# Patient Record
Sex: Male | Born: 1950 | Race: White | Hispanic: No | Marital: Single | Smoking: Never smoker
Health system: Southern US, Community
[De-identification: ages and names within clinical notes are randomized; demographics above are authoritative.]

## PROBLEM LIST (undated history)

## (undated) DIAGNOSIS — G4730 Sleep apnea, unspecified: Secondary | ICD-10-CM

## (undated) DIAGNOSIS — E7800 Pure hypercholesterolemia, unspecified: Secondary | ICD-10-CM

## (undated) DIAGNOSIS — R911 Solitary pulmonary nodule: Secondary | ICD-10-CM

## (undated) DIAGNOSIS — I10 Essential (primary) hypertension: Secondary | ICD-10-CM

## (undated) DIAGNOSIS — N289 Disorder of kidney and ureter, unspecified: Secondary | ICD-10-CM

## (undated) HISTORY — DX: Solitary pulmonary nodule: R91.1

## (undated) HISTORY — PX: NEPHRECTOMY: SHX65

---

## 2003-11-21 ENCOUNTER — Emergency Department (HOSPITAL_COMMUNITY): Admission: EM | Admit: 2003-11-21 | Discharge: 2003-11-21 | Payer: Self-pay | Admitting: Family Medicine

## 2008-03-15 ENCOUNTER — Emergency Department (HOSPITAL_COMMUNITY): Admission: EM | Admit: 2008-03-15 | Discharge: 2008-03-15 | Payer: Self-pay | Admitting: Emergency Medicine

## 2008-03-18 ENCOUNTER — Encounter: Admission: RE | Admit: 2008-03-18 | Discharge: 2008-03-18 | Payer: Self-pay | Admitting: Internal Medicine

## 2008-03-22 ENCOUNTER — Observation Stay (HOSPITAL_COMMUNITY): Admission: AD | Admit: 2008-03-22 | Discharge: 2008-03-23 | Payer: Self-pay | Admitting: Internal Medicine

## 2008-03-22 ENCOUNTER — Encounter: Payer: Self-pay | Admitting: Emergency Medicine

## 2009-11-14 ENCOUNTER — Ambulatory Visit: Payer: Self-pay | Admitting: Internal Medicine

## 2009-11-14 ENCOUNTER — Observation Stay (HOSPITAL_COMMUNITY): Admission: EM | Admit: 2009-11-14 | Discharge: 2009-11-15 | Payer: Self-pay | Admitting: Emergency Medicine

## 2009-11-15 ENCOUNTER — Encounter (INDEPENDENT_AMBULATORY_CARE_PROVIDER_SITE_OTHER): Payer: Self-pay | Admitting: Emergency Medicine

## 2010-08-22 LAB — POCT CARDIAC MARKERS
CKMB, poc: <1 ng/mL — ABNORMAL LOW (ref 1.0–8.0)
CKMB, poc: <1 ng/mL — ABNORMAL LOW (ref 1.0–8.0)
Troponin i, poc: <0.05 ng/mL (ref 0.00–0.09)
Troponin i, poc: <0.05 ng/mL (ref 0.00–0.09)
Troponin i, poc: <0.05 ng/mL (ref 0.00–0.09)

## 2010-08-22 LAB — DIFFERENTIAL
Basophils Absolute: 0 10*3/uL (ref 0.0–0.1)
Basophils Relative: 1 % (ref 0–1)
Eosinophils Relative: 5 % (ref 0–5)
Lymphocytes Relative: 28 % (ref 12–46)
Monocytes Absolute: 0.7 10*3/uL (ref 0.1–1.0)
Monocytes Relative: 9 % (ref 3–12)
Neutrophils Relative %: 57 % (ref 43–77)

## 2010-08-22 LAB — CBC: RBC: 4.38 MIL/uL (ref 4.22–5.81)

## 2010-08-22 LAB — BASIC METABOLIC PANEL
CO2: 25 mEq/L (ref 19–32)
Chloride: 109 mEq/L (ref 96–112)
GFR calc Af Amer: >60 mL/min (ref >60)
GFR calc non Af Amer: >60 mL/min (ref >60)
Glucose, Bld: 96 mg/dL (ref 70–99)
Potassium: 3.2 mEq/L — ABNORMAL LOW (ref 3.5–5.1)
Sodium: 137 mEq/L (ref 135–145)

## 2010-10-18 NOTE — H&P (Signed)
NAME:  Ethan Torres, Ethan Torres NO.:  [id number]   MEDICAL RECORD NO.:  [id number]          PATIENT TYPE:  EMS   LOCATION:  ED                           FACILITY:  [facility]   PHYSICIAN:  Lucita Ferrara, MD         DATE OF BIRTH:  18-Jul-1950   DATE OF ADMISSION:  03/22/2008  DATE OF DISCHARGE:                              HISTORY & PHYSICAL   PRIMARY CARE PHYSICIAN:  [facility], unassigned.   CHIEF COMPLAINT:  Midepigastric burning chest pain.   The patient is a 60 year old Caucasian male with past medical history  significant for obstructive sleep apnea, hypertension who presents to  [facility] with chest pain that is described as burning  in nature, accompanied by food intake associated with some nausea.  He  also had some questionable right upper quadrant pain that was noticed  about 1 week ago by his primary care doctor at [facility].  He was  written a script for gallbladder ultrasound which was within normal.  The patient denies history of coronary artery disease, cardiovascular  disease, arrhythmias.  He denies ever having colonoscopy or endoscopy.  He is 27 and he says I am due for one.  He denies orthopnea,  paroxysmal nocturnal dyspnea, lower extremity edema or symptoms  associated with congestive heart failure or cardiopulmonary disease.  He  is a nonsmoker.  Denies alcohol.  Denies nonsteroidal anti-inflammatory  medications.  He denies changes in his bowel habits; constipation,  diarrhea.  He denies dark or black stools.  He denies recent sick  contacts or travel history.  Risk factors for coronary artery disease  are moderate.  He has no family history of early coronary artery disease  although his father died of metastatic lung cancer supposedly at an  early age.  His mother is still alive and he said she does not have  coronary artery disease.  Unknown whether there is any family history of  colon cancer.   PAST MEDICAL HISTORY:  1. As above,  significant for obstructive sleep apnea.  2. Hypertension.   FAMILY HISTORY:  As above.  Father had lung cancer.  Mother is alive and  well.  Siblings do not have premature coronary artery disease.   PAST SURGICAL HISTORY:  Status post tonsillectomy.   SOCIAL HISTORY:  Denies drugs, alcohol or tobacco.   ALLERGIES:  No known drug allergies.   MEDICATIONS AT HOME:  1. Include atenolol 50 mg p.o. daily.  2. Hydrochlorothiazide 25 mg p.o. daily.  3. Aspirin 81 mg p.o. daily.  4. Nexium 40 mg which he started October 2009.  5. Ambien CR 12.5 mg at bedtime.  6. Promethazine 25 mg a day.  7. Lexapro 5 mg daily.   REVIEW OF SYSTEMS:  As per HPI otherwise negative.   PHYSICAL EXAMINATION:  GENERAL:  Generally speaking the patient is in no  acute distress.  VITAL SIGNS:  Blood pressure is 110/66, pulse is 52, respirations 18,  pulse ox 100% on room air.  HEENT:  Normocephalic, atraumatic.  Sclerae is anicteric.  NECK:  Supple.  No JVD, no carotid bruits.  PERRLA.  Extraocular muscles  intact.  CARDIOVASCULAR:  S1, S2.  Regular rate and rhythm.  No murmurs, rubs or  clicks.  LUNGS:  Clear to auscultation bilaterally.  No rhonchi, rales or  wheezes.  ABDOMEN:  Soft, nontender, nondistended.  Positive bowel sounds.  NEUROLOGICAL:  The patient is alert and oriented x3.  Cranial nerves II-  XII grossly intact.  EXTREMITIES:  No clubbing, cyanosis or edema.   EKG - pulse is 52, sinus bradycardia.  There is some right axis  deviation.  There is inverted T-waves in aVL 2, 1, with artifact in 3  and aVF.   LABORATORY DATA:  The patient had cardiac markers x2 which is  essentially negative.  Chem stat within normal limits.  Lipase, complete  metabolic panel, CBC dated March 15, 2008, were not repeated here.  However, overall is normal.   RADIOLOGICAL DATA:  The patient had a chest x-ray which shows no active  cardiopulmonary disease.  Ultrasound of the abdomen which shows normal   gallbladder, no biliary dilatation, mild fatty infiltration of the  liver.   ASSESSMENT AND PLAN:  A 60 year old with:  1. Atypical chest pain associated with food, gastritis, nausea.  Risk      factors for coronary artery disease include hypertension,      obstructive sleep apnea, hyperlipidemia.  2. EKG abnormalities with inverted T-waves in nonfocal leads including      1, 2, aVL, nonspecific, consider read error.  Will need to compare      to old EKG which is not present here now.  3. Gastroesophageal reflux disease.  4. Hypertension, well-controlled.  5. Hyperlipidemia, well-controlled.   DISCUSSION AND PLAN:  We will go ahead and admit the patient to the  medical telemetry unit.  Will repeat his EKG.  ACS protocol.  Will cycle  enzymes x3 every 8 hours.  Continue with Ecotrin.  Protonix 40 mg p.o.  b.i.d.  If his cardiac enzymes are negative the patient will benefit  from outpatient stress test to be scheduled.  The patient also needs a  GI workup including colonoscopy and endoscopy as he is due for  colonoscopy at age greater than 89.  Endoscopy will be beneficial given  his recurrent gastroesophageal reflux disease symptoms.  If his nausea  persists even though his gallbladder ultrasound is negative and LFTs  which I am going to repeat now are abnormal the patient would benefit  from a HIDA scan to make sure that he does not have a thick gallbladder.  Also, I will proceed with getting a lipase on him.  The rest of the  plans will depend on his progress.      Lucita Ferrara, MD  Electronically Signed     RR/MEDQ  D:  03/22/2008  T:  03/22/2008  Job:  [id number]

## 2011-01-30 ENCOUNTER — Emergency Department (HOSPITAL_COMMUNITY)
Admission: EM | Admit: 2011-01-30 | Discharge: 2011-01-30 | Disposition: A | Discharge status: Home / Self Care | Payer: 59 | Attending: Emergency Medicine | Admitting: Emergency Medicine

## 2011-01-30 ENCOUNTER — Emergency Department (HOSPITAL_COMMUNITY): Payer: 59

## 2011-01-30 DIAGNOSIS — R059 Cough, unspecified: Secondary | ICD-10-CM | POA: Insufficient documentation

## 2011-01-30 DIAGNOSIS — K219 Gastro-esophageal reflux disease without esophagitis: Secondary | ICD-10-CM | POA: Insufficient documentation

## 2011-01-30 DIAGNOSIS — E876 Hypokalemia: Secondary | ICD-10-CM | POA: Insufficient documentation

## 2011-01-30 DIAGNOSIS — R61 Generalized hyperhidrosis: Secondary | ICD-10-CM | POA: Insufficient documentation

## 2011-01-30 DIAGNOSIS — F411 Generalized anxiety disorder: Secondary | ICD-10-CM | POA: Insufficient documentation

## 2011-01-30 DIAGNOSIS — R110 Nausea: Secondary | ICD-10-CM | POA: Insufficient documentation

## 2011-01-30 DIAGNOSIS — E7800 Pure hypercholesterolemia, unspecified: Secondary | ICD-10-CM | POA: Insufficient documentation

## 2011-01-30 DIAGNOSIS — R05 Cough: Secondary | ICD-10-CM | POA: Insufficient documentation

## 2011-01-30 DIAGNOSIS — I10 Essential (primary) hypertension: Secondary | ICD-10-CM | POA: Insufficient documentation

## 2011-01-30 DIAGNOSIS — R6883 Chills (without fever): Secondary | ICD-10-CM | POA: Insufficient documentation

## 2011-01-30 DIAGNOSIS — Z7982 Long term (current) use of aspirin: Secondary | ICD-10-CM | POA: Insufficient documentation

## 2011-01-30 DIAGNOSIS — R0789 Other chest pain: Secondary | ICD-10-CM | POA: Insufficient documentation

## 2011-01-30 DIAGNOSIS — Z79899 Other long term (current) drug therapy: Secondary | ICD-10-CM | POA: Insufficient documentation

## 2011-01-30 LAB — POCT I-STAT, CHEM 8
Calcium, Ion: 1.19 mmol/L (ref 1.12–1.32)
Creatinine, Ser: 1.1 mg/dL (ref 0.50–1.35)
Glucose, Bld: 112 mg/dL — ABNORMAL HIGH (ref 70–99)
Hemoglobin: 15.3 g/dL (ref 13.0–17.0)
Potassium: 3 mEq/L — ABNORMAL LOW (ref 3.5–5.1)
Sodium: 141 mEq/L (ref 135–145)
TCO2: 25 mmol/L (ref 0–100)

## 2011-01-30 LAB — CBC
Platelets: 202 10*3/uL (ref 150–400)
RBC: 4.53 MIL/uL (ref 4.22–5.81)

## 2011-01-30 LAB — POCT I-STAT TROPONIN I

## 2011-02-18 DIAGNOSIS — I10 Essential (primary) hypertension: Secondary | ICD-10-CM | POA: Insufficient documentation

## 2011-02-18 DIAGNOSIS — R079 Chest pain, unspecified: Secondary | ICD-10-CM | POA: Insufficient documentation

## 2011-03-07 LAB — COMPREHENSIVE METABOLIC PANEL
ALT: 21
ALT: 25
Albumin: 3.9
Alkaline Phosphatase: 51
Alkaline Phosphatase: 52
BUN: 14
BUN: 15
BUN: 15
CO2: 28
CO2: 28
Calcium: 9.2
Calcium: 9.8
Chloride: 104
Chloride: 97
Creatinine, Ser: 1.13
Creatinine, Ser: 1.24
GFR calc non Af Amer: >60
GFR calc non Af Amer: >60
GFR calc non Af Amer: >60
Glucose, Bld: 101 — ABNORMAL HIGH
Glucose, Bld: 111 — ABNORMAL HIGH
Glucose, Bld: 116 — ABNORMAL HIGH
Potassium: 2.7 — CL
Sodium: 137
Sodium: 142
Total Bilirubin: 1.1
Total Protein: 6.1
Total Protein: 7.4

## 2011-03-07 LAB — DIFFERENTIAL
Basophils Relative: 0
Eosinophils Absolute: 0.1
Eosinophils Relative: 1
Lymphocytes Relative: 14
Lymphs Abs: 1.4
Neutro Abs: 8.3 — ABNORMAL HIGH

## 2011-03-07 LAB — CARDIAC PANEL(CRET KIN+CKTOT+MB+TROPI)
CK, MB: 1.3
Relative Index: 1.2
Relative Index: INVALID
Total CK: 109
Total CK: 98

## 2011-03-07 LAB — URINALYSIS, ROUTINE W REFLEX MICROSCOPIC
Bilirubin Urine: NEGATIVE
Glucose, UA: NEGATIVE
Hgb urine dipstick: NEGATIVE
Protein, ur: NEGATIVE
Specific Gravity, Urine: 1.021
pH: 8

## 2011-03-07 LAB — POCT CARDIAC MARKERS
CKMB, poc: 1.2
Myoglobin, poc: 92.3
Troponin i, poc: <0.05

## 2011-03-07 LAB — CBC
HCT: 43.1
HCT: 43.7
MCHC: 34.1
MCV: 98
Platelets: 196
RBC: 4.46
RBC: 4.48
RDW: 12.8
WBC: 10.5
WBC: 5.3
WBC: 7.2

## 2011-03-07 LAB — POCT I-STAT, CHEM 8
Calcium, Ion: 1.11 — ABNORMAL LOW
Chloride: 101
Creatinine, Ser: 1.4
Hemoglobin: 14.3
TCO2: 28

## 2011-03-07 LAB — LIPASE, BLOOD
Lipase: 39
Lipase: 40
Lipase: 40

## 2011-03-07 LAB — B-NATRIURETIC PEPTIDE (CONVERTED LAB): Pro B Natriuretic peptide (BNP): 64

## 2011-03-07 LAB — LIPID PANEL
Cholesterol: 126
LDL Cholesterol: 84
VLDL: 17

## 2011-03-07 LAB — H. PYLORI ANTIBODY, IGG
H Pylori IgG: <0.4
H Pylori IgG: <0.4

## 2011-04-02 DIAGNOSIS — M109 Gout, unspecified: Secondary | ICD-10-CM

## 2011-04-02 HISTORY — DX: Gout, unspecified: M10.9

## 2012-11-21 DIAGNOSIS — G4700 Insomnia, unspecified: Secondary | ICD-10-CM | POA: Insufficient documentation

## 2013-08-04 ENCOUNTER — Emergency Department (HOSPITAL_COMMUNITY): Payer: 59

## 2013-08-04 ENCOUNTER — Emergency Department (HOSPITAL_COMMUNITY)
Admission: EM | Admit: 2013-08-04 | Discharge: 2013-08-04 | Disposition: A | Discharge status: Home / Self Care | Payer: 59 | Attending: Emergency Medicine | Admitting: Emergency Medicine

## 2013-08-04 ENCOUNTER — Encounter (HOSPITAL_COMMUNITY): Payer: Self-pay | Admitting: Emergency Medicine

## 2013-08-04 DIAGNOSIS — N289 Disorder of kidney and ureter, unspecified: Secondary | ICD-10-CM

## 2013-08-04 DIAGNOSIS — Z8639 Personal history of other endocrine, nutritional and metabolic disease: Secondary | ICD-10-CM | POA: Insufficient documentation

## 2013-08-04 DIAGNOSIS — Z87442 Personal history of urinary calculi: Secondary | ICD-10-CM | POA: Insufficient documentation

## 2013-08-04 DIAGNOSIS — Z862 Personal history of diseases of the blood and blood-forming organs and certain disorders involving the immune mechanism: Secondary | ICD-10-CM | POA: Insufficient documentation

## 2013-08-04 DIAGNOSIS — N23 Unspecified renal colic: Secondary | ICD-10-CM | POA: Insufficient documentation

## 2013-08-04 DIAGNOSIS — R110 Nausea: Secondary | ICD-10-CM | POA: Insufficient documentation

## 2013-08-04 DIAGNOSIS — I10 Essential (primary) hypertension: Secondary | ICD-10-CM | POA: Insufficient documentation

## 2013-08-04 HISTORY — DX: Sleep apnea, unspecified: G47.30

## 2013-08-04 HISTORY — DX: Essential (primary) hypertension: I10

## 2013-08-04 HISTORY — DX: Pure hypercholesterolemia, unspecified: E78.00

## 2013-08-04 HISTORY — DX: Disorder of kidney and ureter, unspecified: N28.9

## 2013-08-04 LAB — CBC WITH DIFFERENTIAL/PLATELET
BASOS PCT: 0 % (ref 0–1)
Basophils Absolute: 0 10*3/uL (ref 0.0–0.1)
EOS ABS: 0.2 10*3/uL (ref 0.0–0.7)
Eosinophils Relative: 2 % (ref 0–5)
HEMATOCRIT: 36.5 % — AB (ref 39.0–52.0)
HEMOGLOBIN: 13.1 g/dL (ref 13.0–17.0)
Lymphocytes Relative: 11 % — ABNORMAL LOW (ref 12–46)
Lymphs Abs: 1.1 10*3/uL (ref 0.7–4.0)
MCH: 33.7 pg (ref 26.0–34.0)
MCHC: 35.9 g/dL (ref 30.0–36.0)
MCV: 93.8 fL (ref 78.0–100.0)
MONO ABS: 0.8 10*3/uL (ref 0.1–1.0)
MONOS PCT: 8 % (ref 3–12)
NEUTROS PCT: 79 % — AB (ref 43–77)
Neutro Abs: 7.9 10*3/uL — ABNORMAL HIGH (ref 1.7–7.7)
Platelets: 179 10*3/uL (ref 150–400)
RBC: 3.89 MIL/uL — ABNORMAL LOW (ref 4.22–5.81)
RDW: 11.8 % (ref 11.5–15.5)
WBC: 10 10*3/uL (ref 4.0–10.5)

## 2013-08-04 LAB — URINALYSIS, ROUTINE W REFLEX MICROSCOPIC
BILIRUBIN URINE: NEGATIVE
Glucose, UA: NEGATIVE mg/dL
KETONES UR: NEGATIVE mg/dL
Leukocytes, UA: NEGATIVE
NITRITE: NEGATIVE
PH: 7 (ref 5.0–8.0)
Protein, ur: NEGATIVE mg/dL
Specific Gravity, Urine: 1.015 (ref 1.005–1.030)
Urobilinogen, UA: 0.2 mg/dL (ref 0.0–1.0)

## 2013-08-04 LAB — COMPREHENSIVE METABOLIC PANEL
ALBUMIN: 3.4 g/dL — AB (ref 3.5–5.2)
ALT: 19 U/L (ref 0–53)
AST: 20 U/L (ref 0–37)
Alkaline Phosphatase: 50 U/L (ref 39–117)
BUN: 26 mg/dL — AB (ref 6–23)
CO2: 26 mEq/L (ref 19–32)
CREATININE: 1.41 mg/dL — AB (ref 0.50–1.35)
Calcium: 8 mg/dL — ABNORMAL LOW (ref 8.4–10.5)
Chloride: 104 mEq/L (ref 96–112)
GFR calc Af Amer: 61 mL/min — ABNORMAL LOW (ref >90)
GFR calc non Af Amer: 53 mL/min — ABNORMAL LOW (ref >90)
Glucose, Bld: 108 mg/dL — ABNORMAL HIGH (ref 70–99)
Potassium: 3.4 mEq/L — ABNORMAL LOW (ref 3.7–5.3)
Sodium: 140 mEq/L (ref 137–147)
TOTAL PROTEIN: 6.1 g/dL (ref 6.0–8.3)
Total Bilirubin: 0.5 mg/dL (ref 0.3–1.2)

## 2013-08-04 LAB — URINE MICROSCOPIC-ADD ON

## 2013-08-04 MED ORDER — SODIUM CHLORIDE 0.9 % IV SOLN
1000.0000 mL | INTRAVENOUS | Status: DC
  Administered 2013-08-04: 1000 mL via INTRAVENOUS

## 2013-08-04 MED ORDER — HYDROMORPHONE HCL PF 1 MG/ML IJ SOLN
1.0000 mg | Freq: Once | INTRAMUSCULAR | Status: AC
  Administered 2013-08-04: 1 mg via INTRAVENOUS
  Filled 2013-08-04: qty 1

## 2013-08-04 MED ORDER — ONDANSETRON HCL 4 MG/2ML IJ SOLN
4.0000 mg | Freq: Once | INTRAMUSCULAR | Status: AC
  Administered 2013-08-04: 4 mg via INTRAVENOUS
  Filled 2013-08-04: qty 2

## 2013-08-04 MED ORDER — DIPHENHYDRAMINE HCL 50 MG/ML IJ SOLN
25.0000 mg | Freq: Once | INTRAMUSCULAR | Status: AC
  Administered 2013-08-04: 25 mg via INTRAVENOUS
  Filled 2013-08-04: qty 1

## 2013-08-04 MED ORDER — METOCLOPRAMIDE HCL 5 MG/ML IJ SOLN
10.0000 mg | Freq: Once | INTRAMUSCULAR | Status: AC
  Administered 2013-08-04: 10 mg via INTRAVENOUS
  Filled 2013-08-04: qty 2

## 2013-08-04 MED ORDER — OXYCODONE-ACETAMINOPHEN 5-325 MG PO TABS: 1.0000 | ORAL_TABLET | ORAL | Status: DC | PRN

## 2013-08-04 MED ORDER — TAMSULOSIN HCL 0.4 MG PO CAPS: 0.4000 mg | ORAL_CAPSULE | Freq: Every day | ORAL | Status: DC

## 2013-08-04 MED ORDER — SODIUM CHLORIDE 0.9 % IV SOLN
1000.0000 mL | Freq: Once | INTRAVENOUS | Status: AC
Start: 2013-08-04 — End: 2013-08-04
  Administered 2013-08-04: 1000 mL via INTRAVENOUS

## 2013-08-04 MED ORDER — METOCLOPRAMIDE HCL 10 MG PO TABS: 10.0000 mg | ORAL_TABLET | Freq: Four times a day (QID) | ORAL | Status: DC | PRN

## 2013-08-04 NOTE — ED Notes (Signed)
C/o nausea, pain still under control. 1/10

## 2013-08-04 NOTE — ED Provider Notes (Signed)
CSN: [id number]     Arrival date & time 08/04/13  0425 History   None    Chief Complaint  Patient presents with  . Flank Pain     (Consider location/radiation/quality/duration/timing/severity/associated sxs/prior Treatment) Patient is a 63 y.o. male presenting with flank pain. The history is provided by the patient.  Flank Pain  He was awakened at 1 AM with severe, sharp left flank pain without radiation. There is associated nausea without vomiting. There was no fever, chills, or sweats. He denies any urinary symptoms. Pain is rated at 7/10. He was unable to find a position that was comfortable. Nothing made the pain better nothing made it worse. He came in by ambulance and was given a dose of fentanyl with partial relief of pain. He states he does have a history of kidney stones many years ago.  Past Medical History  Diagnosis Date  . Renal disorder   . Hypertension   . High cholesterol   . Sleep apnea    History reviewed. No pertinent past surgical history. No family history on file. History  Substance Use Topics  . Smoking status: Never Smoker   . Smokeless tobacco: Not on file  . Alcohol Use: No    Review of Systems  Genitourinary: Positive for flank pain.  All other systems reviewed and are negative.      Allergies  Review of patient's allergies indicates no known allergies.  Home Medications  No current outpatient prescriptions on file. BP 129/75  Pulse 64  Temp(Src) 98.2 F (36.8 C) (Oral)  Resp 18  Ht 5\' 11"  (1.803 m)  Wt 187 lb (84.823 kg)  BMI 26.09 kg/m2  SpO2 98% Physical Exam  Nursing note and vitals reviewed.  63 year old male, resting comfortably and in no acute distress. Vital signs are normal. Oxygen saturation is 98%, which is normal. Head is normocephalic and atraumatic. PERRLA, EOMI. Oropharynx is clear. Neck is nontender and supple without adenopathy or JVD. Back is nontender in the midline. There is moderate left CVA tenderness. Lungs  are clear without rales, wheezes, or rhonchi. Chest is nontender. Heart has regular rate and rhythm without murmur. Abdomen is soft, flat, nontender without masses or hepatosplenomegaly and peristalsis is hypoactive. Extremities have no cyanosis or edema, full range of motion is present. Skin is warm and dry without rash. Neurologic: Mental status is normal, cranial nerves are intact, there are no motor or sensory deficits.  ED Course  Procedures (including critical care time) Labs Review Results for orders placed during the hospital encounter of 08/04/13  URINALYSIS, ROUTINE W REFLEX MICROSCOPIC      Result Value Ref Range   Color, Urine YELLOW  YELLOW   APPearance CLEAR  CLEAR   Specific Gravity, Urine 1.015  1.005 - 1.030   pH 7.0  5.0 - 8.0   Glucose, UA NEGATIVE  NEGATIVE mg/dL   Hgb urine dipstick LARGE (*) NEGATIVE   Bilirubin Urine NEGATIVE  NEGATIVE   Ketones, ur NEGATIVE  NEGATIVE mg/dL   Protein, ur NEGATIVE  NEGATIVE mg/dL   Urobilinogen, UA 0.2  0.0 - 1.0 mg/dL   Nitrite NEGATIVE  NEGATIVE   Leukocytes, UA NEGATIVE  NEGATIVE  CBC WITH DIFFERENTIAL      Result Value Ref Range   WBC 10.0  4.0 - 10.5 K/uL   RBC 3.89 (*) 4.22 - 5.81 MIL/uL   Hemoglobin 13.1  13.0 - 17.0 g/dL   HCT 40.9 (*) 81.1 - 91.4 %   MCV 93.8  78.0 - 100.0 fL   MCH 33.7  26.0 - 34.0 pg   MCHC 35.9  30.0 - 36.0 g/dL   RDW 82.911.8  56.211.5 - 13.015.5 %   Platelets 179  150 - 400 K/uL   Neutrophils Relative % 79 (*) 43 - 77 %   Neutro Abs 7.9 (*) 1.7 - 7.7 K/uL   Lymphocytes Relative 11 (*) 12 - 46 %   Lymphs Abs 1.1  0.7 - 4.0 K/uL   Monocytes Relative 8  3 - 12 %   Monocytes Absolute 0.8  0.1 - 1.0 K/uL   Eosinophils Relative 2  0 - 5 %   Eosinophils Absolute 0.2  0.0 - 0.7 K/uL   Basophils Relative 0  0 - 1 %   Basophils Absolute 0.0  0.0 - 0.1 K/uL  COMPREHENSIVE METABOLIC PANEL      Result Value Ref Range   Sodium 140  137 - 147 mEq/L   Potassium 3.4 (*) 3.7 - 5.3 mEq/L   Chloride 104  96 -  112 mEq/L   CO2 26  19 - 32 mEq/L   Glucose, Bld 108 (*) 70 - 99 mg/dL   BUN 26 (*) 6 - 23 mg/dL   Creatinine, Ser 8.651.41 (*) 0.50 - 1.35 mg/dL   Calcium 8.0 (*) 8.4 - 10.5 mg/dL   Total Protein 6.1  6.0 - 8.3 g/dL   Albumin 3.4 (*) 3.5 - 5.2 g/dL   AST 20  0 - 37 U/L   ALT 19  0 - 53 U/L   Alkaline Phosphatase 50  39 - 117 U/L   Total Bilirubin 0.5  0.3 - 1.2 mg/dL   GFR calc non Af Amer 53 (*) >90 mL/min   GFR calc Af Amer 61 (*) >90 mL/min  URINE MICROSCOPIC-ADD ON      Result Value Ref Range   Squamous Epithelial / LPF RARE  RARE   WBC, UA 0-2  <3 WBC/hpf   RBC / HPF 7-10  <3 RBC/hpf   Bacteria, UA RARE  RARE   Imaging Review Ct Abdomen Pelvis Wo Contrast  08/04/2013   CLINICAL DATA:  Left flank pain, history of kidney stones.  EXAM: CT ABDOMEN AND PELVIS WITHOUT CONTRAST  TECHNIQUE: Multidetector CT imaging of the abdomen and pelvis was performed following the standard protocol without intravenous contrast.  COMPARISON:  US ABDOMEN COMPLETE dated 03/18/2008  FINDINGS: Included view of the lung bases are clear. The visualized heart and pericardium are unremarkable.  Kidneys are orthotopic, demonstrating normal size and morphology. 3 mm left lower pole renal calculus, punctate left upper pole calculus. Mild to moderate left hydroureteronephrosis to the level of the mid ureter where a 5 mm calculus is seen. Left perinephric free fluid may reflect caliceal rupture. Nephrolithiasis, hydronephrosis; limited assessment for renal masses on this nonenhanced examination. The unopacified ureters are normal in course and caliber. Urinary bladder is partially distended and unremarkable.  The liver, spleen, gallbladder, pancreas and adrenal glands are unremarkable for this non-contrast examination.  The stomach, small and large bowel are normal in course and caliber without inflammatory changes, the sensitivity may be decreased by lack of enteric contrast. Descending and sigmoid colonic diverticulosis.  Normal appendix. No intraperitoneal free fluid nor free air. No abscess.  Great vessels are normal in course and caliber. No lymphadenopathy by CT size criteria. Prostate gland is enlarged, 5.5 cm in transaxial dimension, invading the base of the urinary bladder with mild urinary bladder circumferential wall thickening. The  soft tissues and included osseous structures are nonsuspicious. Degenerative change of the lumbar spine with mild-to-moderate L4-5 and L5-S1 neural foraminal narrowing.  IMPRESSION: Mild to moderate left hydroureteronephrosis to the level of the mid ureter where a 5 mm calculus is seen. Free fluid about the left kidney may reflect caliceal rupture. Residual left nephrolithiasis measuring up to 3 mm.  Prostatomegaly, invading the base of the urinary bladder with mild urinary bladder wall thickening which could reflect chronic urinary bladder obstruction or, cystitis.   Electronically Signed   By: Awilda Metro   On: 08/04/2013 05:46   Images viewed by me.  MDM   Final diagnoses:  Ureteral colic  Renal insufficiency    Left flank pain suspicious for ureteral colic. He'll be sent for CT scan. In the meantime, he is given hydromorphone for pain and ondansetron for nausea.  He got good relief of pain with hydromorphone but ondansetron did not adequately control his nausea. He was given metoclopramide with diphenhydramine with good relief of nausea. CT shows a 5 mm calculus in the left mid ureter. Explained to patient that this may need specialized care of a urologist to help it to pass out. He is discharged with prescriptions for oxycodone and acetaminophen, metoclopramide, and tamsulosin and is referred to urology for followup.  Dione Booze, MD 08/04/13 [id number]

## 2013-08-04 NOTE — ED Notes (Signed)
Patient presents to ED via [facility] with c/o left flank pain.

## 2013-08-04 NOTE — Discharge Instructions (Signed)
Ureteral Colic (Kidney Stones) Ureteral colic is the result of a condition when kidney stones form inside the kidney. Once kidney stones are formed they may move into the tube that connects the kidney with the bladder (ureter). If this occurs, this condition may cause pain (colic) in the ureter.  CAUSES  Pain is caused by stone movement in the ureter and the obstruction caused by the stone. SYMPTOMS  The pain comes and goes as the ureter contracts around the stone. The pain is usually intense, sharp, and stabbing in character. The location of the pain may move as the stone moves through the ureter. When the stone is near the kidney the pain is usually located in the back and radiates to the belly (abdomen). When the stone is ready to pass into the bladder the pain is often located in the lower abdomen on the side the stone is located. At this location, the symptoms may mimic those of a urinary tract infection with urinary frequency. Once the stone is located here it often passes into the bladder and the pain disappears completely. TREATMENT   Your caregiver will provide you with medicine for pain relief.  You may require specialized follow-up X-rays.  The absence of pain does not always mean that the stone has passed. It may have just stopped moving. If the urine remains completely obstructed, it can cause loss of kidney function or even complete destruction of the involved kidney. It is your responsibility and in your interest that X-rays and follow-ups as suggested by your caregiver are completed. Relief of pain without passage of the stone can be associated with severe damage to the kidney, including loss of kidney function on that side.  If your stone does not pass on its own, additional measures may be taken by your caregiver to ensure its removal. HOME CARE INSTRUCTIONS   Increase your fluid intake. Water is the preferred fluid since juices containing vitamin C may acidify the urine making it  less likely for certain stones (uric acid stones) to pass.  Strain all urine. A strainer will be provided. Keep all particulate matter or stones for your caregiver to inspect.  Take your pain medicine as directed.  Make a follow-up appointment with your caregiver as directed.  Remember that the goal is passage of your stone. The absence of pain does not mean the stone is gone. Follow your caregiver's instructions.  Only take over-the-counter or prescription medicines for pain, discomfort, or fever as directed by your caregiver. SEEK MEDICAL CARE IF:   Pain cannot be controlled with the prescribed medicine.  You have a fever.  Pain continues for longer than your caregiver advises it should.  There is a change in the pain, and you develop chest discomfort or constant abdominal pain.  You feel faint or pass out. MAKE SURE YOU:   Understand these instructions.  Will watch your condition.  Will get help right away if you are not doing well or get worse. Document Released: 03/01/2005 Document Revised: 09/16/2012 Document Reviewed: 11/16/2010 [facility] Patient Information 2014 [place], [place].  Acetaminophen; Oxycodone tablets What is this medicine? ACETAMINOPHEN; OXYCODONE (a set a MEE noe fen; ox i KOE done) is a pain reliever. It is used to treat mild to moderate pain. This medicine may be used for other purposes; ask your health care provider or pharmacist if you have questions. COMMON BRAND NAME(S): Endocet, Magnacet, Narvox, Percocet, Perloxx, Primalev, Primlev, Roxicet, Xolox What should I tell my health care provider before I  take this medicine? They need to know if you have any of these conditions: -brain tumor -Crohn's disease, inflammatory bowel disease, or ulcerative colitis -drug abuse or addiction -head injury -heart or circulation problems -if you often drink alcohol -kidney disease or problems going to the bathroom -liver disease -lung disease, asthma, or  breathing problems -an unusual or allergic reaction to acetaminophen, oxycodone, other opioid analgesics, other medicines, foods, dyes, or preservatives -pregnant or trying to get pregnant -breast-feeding How should I use this medicine? Take this medicine by mouth with a full glass of water. Follow the directions on the prescription label. Take your medicine at regular intervals. Do not take your medicine more often than directed. Talk to your pediatrician regarding the use of this medicine in children. Special care may be needed. Patients over 73 years old may have a stronger reaction and need a smaller dose. Overdosage: If you think you have taken too much of this medicine contact a poison control center or emergency room at once. NOTE: This medicine is only for you. Do not share this medicine with others. What if I miss a dose? If you miss a dose, take it as soon as you can. If it is almost time for your next dose, take only that dose. Do not take double or extra doses. What may interact with this medicine? -alcohol -antihistamines -barbiturates like amobarbital, butalbital, butabarbital, methohexital, pentobarbital, phenobarbital, thiopental, and secobarbital -benztropine -drugs for bladder problems like solifenacin, trospium, oxybutynin, tolterodine, hyoscyamine, and methscopolamine -drugs for breathing problems like ipratropium and tiotropium -drugs for certain stomach or intestine problems like propantheline, homatropine methylbromide, glycopyrrolate, atropine, belladonna, and dicyclomine -general anesthetics like etomidate, ketamine, nitrous oxide, propofol, desflurane, enflurane, halothane, isoflurane, and sevoflurane -medicines for depression, anxiety, or psychotic disturbances -medicines for sleep -muscle relaxants -naltrexone -narcotic medicines (opiates) for pain -phenothiazines like perphenazine, thioridazine, chlorpromazine, mesoridazine, fluphenazine, prochlorperazine,  promazine, and trifluoperazine -scopolamine -tramadol -trihexyphenidyl This list may not describe all possible interactions. Give your health care provider a list of all the medicines, herbs, non-prescription drugs, or dietary supplements you use. Also tell them if you smoke, drink alcohol, or use illegal drugs. Some items may interact with your medicine. What should I watch for while using this medicine? Tell your doctor or health care professional if your pain does not go away, if it gets worse, or if you have new or a different type of pain. You may develop tolerance to the medicine. Tolerance means that you will need a higher dose of the medication for pain relief. Tolerance is normal and is expected if you take this medicine for a long time. Do not suddenly stop taking your medicine because you may develop a severe reaction. Your body becomes used to the medicine. This does NOT mean you are addicted. Addiction is a behavior related to getting and using a drug for a non-medical reason. If you have pain, you have a medical reason to take pain medicine. Your doctor will tell you how much medicine to take. If your doctor wants you to stop the medicine, the dose will be slowly lowered over time to avoid any side effects. You may get drowsy or dizzy. Do not drive, use machinery, or do anything that needs mental alertness until you know how this medicine affects you. Do not stand or sit up quickly, especially if you are an older patient. This reduces the risk of dizzy or fainting spells. Alcohol may interfere with the effect of this medicine. Avoid alcoholic drinks. There are  different types of narcotic medicines (opiates) for pain. If you take more than one type at the same time, you may have more side effects. Give your health care provider a list of all medicines you use. Your doctor will tell you how much medicine to take. Do not take more medicine than directed. Call emergency for help if you have  problems breathing. The medicine will cause constipation. Try to have a bowel movement at least every 2 to 3 days. If you do not have a bowel movement for 3 days, call your doctor or health care professional. Do not take Tylenol (acetaminophen) or medicines that have acetaminophen with this medicine. Too much acetaminophen can be very dangerous. Many nonprescription medicines contain acetaminophen. Always read the labels carefully to avoid taking more acetaminophen. What side effects may I notice from receiving this medicine? Side effects that you should report to your doctor or health care professional as soon as possible: -allergic reactions like skin rash, itching or hives, swelling of the face, lips, or tongue -breathing difficulties, wheezing -confusion -light headedness or fainting spells -severe stomach pain -unusually weak or tired -yellowing of the skin or the whites of the eyes  Side effects that usually do not require medical attention (report to your doctor or health care professional if they continue or are bothersome): -dizziness -drowsiness -nausea -vomiting This list may not describe all possible side effects. Call your doctor for medical advice about side effects. You may report side effects to FDA at 1-800-FDA-1088. Where should I keep my medicine? Keep out of the reach of children. This medicine can be abused. Keep your medicine in a safe place to protect it from theft. Do not share this medicine with anyone. Selling or giving away this medicine is dangerous and against the law. Store at room temperature between 20 and 25 degrees C (68 and 77 degrees F). Keep container tightly closed. Protect from light. This medicine may cause accidental overdose and death if it is taken by other adults, children, or pets. Flush any unused medicine down the toilet to reduce the chance of harm. Do not use the medicine after the expiration date. NOTE: This sheet is a summary. It may not cover  all possible information. If you have questions about this medicine, talk to your doctor, pharmacist, or health care provider.  2014, Elsevier/[employer] Standard. (2013-01-13 13:17:35)  Metoclopramide tablets What is this medicine? METOCLOPRAMIDE (met oh kloe PRA mide) is used to treat the symptoms of gastroesophageal reflux disease (GERD) like heartburn. It is also used to treat people with slow emptying of the stomach and intestinal tract. This medicine may be used for other purposes; ask your health care provider or pharmacist if you have questions. COMMON BRAND NAME(S): Reglan What should I tell my health care provider before I take this medicine? They need to know if you have any of these conditions: -breast cancer -depression -diabetes -heart failure -high blood pressure -kidney disease -liver disease -Parkinson's disease or a movement disorder -pheochromocytoma -seizures -stomach obstruction, bleeding, or perforation -an unusual or allergic reaction to metoclopramide, procainamide, sulfites, other medicines, foods, dyes, or preservatives -pregnant or trying to get pregnant -breast-feeding How should I use this medicine? Take this medicine by mouth with a glass of water. Follow the directions on the prescription label. Take this medicine on an empty stomach, about 30 minutes before eating. Take your doses at regular intervals. Do not take your medicine more often than directed. Do not stop taking except on the advice  of your doctor or health care professional. A special MedGuide will be given to you by the pharmacist with each prescription and refill. Be sure to read this information carefully each time. Talk to your pediatrician regarding the use of this medicine in children. Special care may be needed. Overdosage: If you think you have taken too much of this medicine contact a poison control center or emergency room at once. NOTE: This medicine is only for you. Do not share this  medicine with others. What if I miss a dose? If you miss a dose, take it as soon as you can. If it is almost time for your next dose, take only that dose. Do not take double or extra doses. What may interact with this medicine? -acetaminophen -cyclosporine -digoxin -medicines for blood pressure -medicines for diabetes, including insulin -medicines for hay fever and other allergies -medicines for depression, especially an Monoamine Oxidase Inhibitor (MAOI) -medicines for Parkinson's disease, like levodopa -medicines for sleep or for pain -tetracycline This list may not describe all possible interactions. Give your health care provider a list of all the medicines, herbs, non-prescription drugs, or dietary supplements you use. Also tell them if you smoke, drink alcohol, or use illegal drugs. Some items may interact with your medicine. What should I watch for while using this medicine? It may take a few weeks for your stomach condition to start to get better. However, do not take this medicine for longer than 12 weeks. The longer you take this medicine, and the more you take it, the greater your chances are of developing serious side effects. If you are an elderly patient, a male patient, or you have diabetes, you may be at an increased risk for side effects from this medicine. Contact your doctor immediately if you start having movements you cannot control such as lip smacking, rapid movements of the tongue, involuntary or uncontrollable movements of the eyes, head, arms and legs, or muscle twitches and spasms. Patients and their families should watch out for worsening depression or thoughts of suicide. Also watch out for any sudden or severe changes in feelings such as feeling anxious, agitated, panicky, irritable, hostile, aggressive, impulsive, severely restless, overly excited and hyperactive, or not being able to sleep. If this happens, especially at the beginning of treatment or after a change  in dose, call your doctor. Do not treat yourself for high fever. Ask your doctor or health care professional for advice. You may get drowsy or dizzy. Do not drive, use machinery, or do anything that needs mental alertness until you know how this drug affects you. Do not stand or sit up quickly, especially if you are an older patient. This reduces the risk of dizzy or fainting spells. Alcohol can make you more drowsy and dizzy. Avoid alcoholic drinks. What side effects may I notice from receiving this medicine? Side effects that you should report to your doctor or health care professional as soon as possible: -allergic reactions like skin rash, itching or hives, swelling of the face, lips, or tongue -abnormal production of milk in females -breast enlargement in both males and females -change in the way you walk -difficulty moving, speaking or swallowing -drooling, lip smacking, or rapid movements of the tongue -excessive sweating -fever -involuntary or uncontrollable movements of the eyes, head, arms and legs -irregular heartbeat or palpitations -muscle twitches and spasms -unusually weak or tired Side effects that usually do not require medical attention (report to your doctor or health care professional if they continue  or are bothersome): -change in sex drive or performance -depressed mood -diarrhea -difficulty sleeping -headache -menstrual changes -restless or nervous This list may not describe all possible side effects. Call your doctor for medical advice about side effects. You may report side effects to FDA at 1-800-FDA-1088. Where should I keep my medicine? Keep out of the reach of children. Store at room temperature between 20 and 25 degrees C (68 and 77 degrees F). Protect from light. Keep container tightly closed. Throw away any unused medicine after the expiration date. NOTE: This sheet is a summary. It may not cover all possible information. If you have questions about this  medicine, talk to your doctor, pharmacist, or health care provider.  2014, [employer]vier/Gold Standard. (2011-09-19 13:04:38)  Tamsulosin capsules What is this medicine? TAMSULOSIN (tam SOO loe sin) is used to treat enlargement of the prostate gland in men, a condition called benign prostatic hyperplasia or BPH. It is not for use in women. It works by relaxing muscles in the prostate and bladder neck. This improves urine flow and reduces BPH symptoms. This medicine may be used for other purposes; ask your health care provider or pharmacist if you have questions. COMMON BRAND NAME(S): Flomax What should I tell my health care provider before I take this medicine? They need to know if you have any of the following conditions: -advanced kidney disease -advanced liver disease -low blood pressure -prostate cancer -an unusual or allergic reaction to tamsulosin, sulfa drugs, other medicines, foods, dyes, or preservatives -pregnant or trying to get pregnant -breast-feeding How should I use this medicine? Take this medicine by mouth about 30 minutes after the same meal every day. Follow the directions on the prescription label. Swallow the capsules whole with a glass of water. Do not crush, chew, or open capsules. Do not take your medicine more often than directed. Do not stop taking your medicine unless your doctor tells you to. Talk to your pediatrician regarding the use of this medicine in children. Special care may be needed. Overdosage: If you think you have taken too much of this medicine contact a poison control center or emergency room at once. NOTE: This medicine is only for you. Do not share this medicine with others. What if I miss a dose? If you miss a dose, take it as soon as you can. If it is almost time for your next dose, take only that dose. Do not take double or extra doses. If you stop taking your medicine for several days or more, ask your doctor or health care professional what dose you  should start back on. What may interact with this medicine? -cimetidine -fluoxetine -ketoconazole -medicines for erectile disfunction like sildenafil, tadalafil, vardenafil -medicines for high blood pressure -other alpha-blockers like alfuzosin, doxazosin, phentolamine, phenoxybenzamine, prazosin, terazosin -warfarin This list may not describe all possible interactions. Give your health care provider a list of all the medicines, herbs, non-prescription drugs, or dietary supplements you use. Also tell them if you smoke, drink alcohol, or use illegal drugs. Some items may interact with your medicine. What should I watch for while using this medicine? Visit your doctor or health care professional for regular check ups. You will need lab work done before you start this medicine and regularly while you are taking it. Check your blood pressure as directed. Ask your health care professional what your blood pressure should be, and when you should contact him or her. This medicine may make you feel dizzy or lightheaded. This is more likely  to happen after the first dose, after an increase in dose, or during hot weather or exercise. Drinking alcohol and taking some medicines can make this worse. Do not drive, use machinery, or do anything that needs mental alertness until you know how this medicine affects you. Do not sit or stand up quickly. If you begin to feel dizzy, sit down until you feel better. These effects can decrease once your body adjusts to the medicine. Contact your doctor or health care professional right away if you have an erection that lasts longer than 4 hours or if it becomes painful. This may be a sign of a serious problem and must be treated right away to prevent permanent damage. If you are thinking of having cataract surgery, tell your eye surgeon that you have taken this medicine. What side effects may I notice from receiving this medicine? Side effects that you should report to your  doctor or health care professional as soon as possible: -allergic reactions like skin rash or itching, hives, swelling of the lips, mouth, tongue, or throat -breathing problems -change in vision -feeling faint or lightheaded -irregular heartbeat -prolonged or painful erection -weakness Side effects that usually do not require medical attention (report to your doctor or health care professional if they continue or are bothersome): -back pain -change in sex drive or performance -constipation, nausea or vomiting -cough -drowsy -runny or stuffy nose -trouble sleeping This list may not describe all possible side effects. Call your doctor for medical advice about side effects. You may report side effects to FDA at 1-800-FDA-1088. Where should I keep my medicine? Keep out of the reach of children. Store at room temperature between 15 and 30 degrees C (59 and 86 degrees F). Throw away any unused medicine after the expiration date. NOTE: This sheet is a summary. It may not cover all possible information. If you have questions about this medicine, talk to your doctor, pharmacist, or health care provider.  2014, [employer] Standard. (2012-05-22 14:11:34)

## 2013-10-09 ENCOUNTER — Other Ambulatory Visit (HOSPITAL_COMMUNITY): Payer: Self-pay | Admitting: Urology

## 2013-10-09 DIAGNOSIS — N201 Calculus of ureter: Secondary | ICD-10-CM

## 2013-10-16 ENCOUNTER — Ambulatory Visit (HOSPITAL_COMMUNITY)
Admission: RE | Admit: 2013-10-16 | Discharge: 2013-10-16 | Disposition: A | Discharge status: Home / Self Care | Payer: 59 | Source: Ambulatory Visit | Attending: Urology | Admitting: Urology

## 2013-10-16 DIAGNOSIS — N400 Enlarged prostate without lower urinary tract symptoms: Secondary | ICD-10-CM | POA: Diagnosis not present

## 2013-10-16 DIAGNOSIS — K5732 Diverticulitis of large intestine without perforation or abscess without bleeding: Secondary | ICD-10-CM | POA: Insufficient documentation

## 2013-10-16 DIAGNOSIS — N201 Calculus of ureter: Secondary | ICD-10-CM

## 2014-08-09 IMAGING — CT CT ABD-PELV W/O CM
2 of 3 series · 17 of 46 positions shown, 19 images · non-contrast
Comparison: CT ABD/PELV WO CM dated 08/04/2013

CLINICAL DATA: Left ureteral calculus

EXAM:
CT ABDOMEN AND PELVIS WITHOUT CONTRAST
TECHNIQUE: Multidetector CT imaging of the abdomen and pelvis was performed
following the standard protocol without IV contrast.

[Series 2: standard/full over (age)lbs 5.0 · axial · 0.75mm/px · z∈[-466,-60]mm · 14 of 94 slices shown, 16 images]
[im 7/94  soft-tissue]
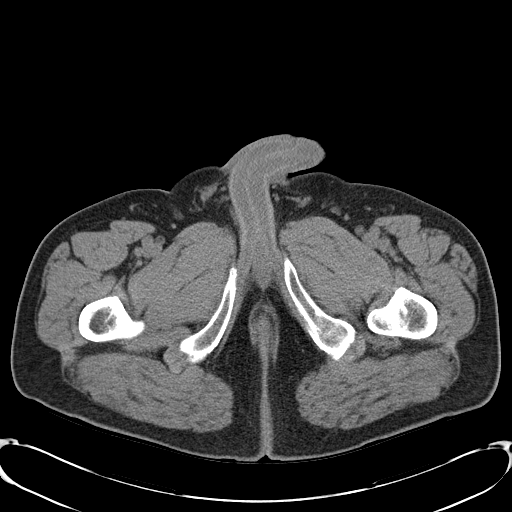
[im 7/94  bone]
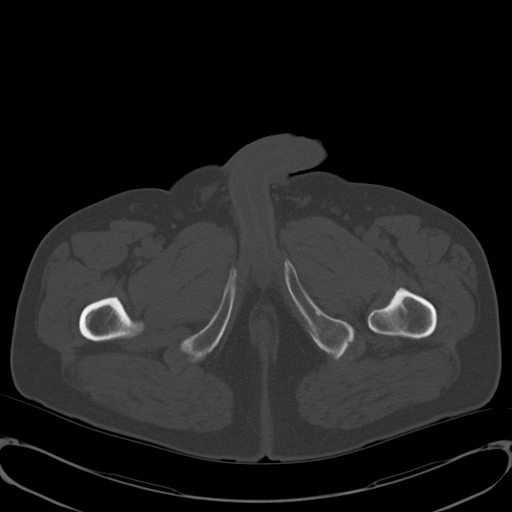
[im 13/94  soft-tissue]
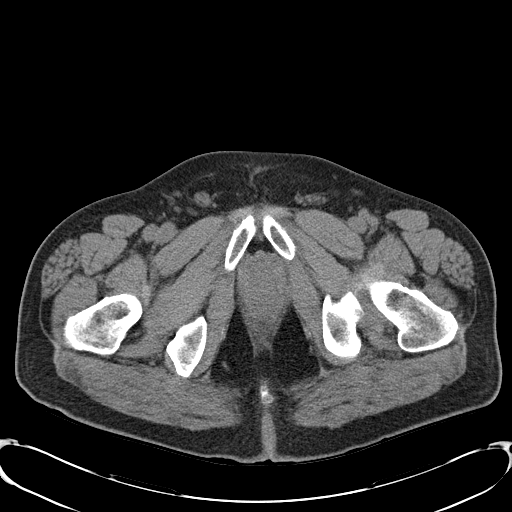
[im 19/94  soft-tissue]
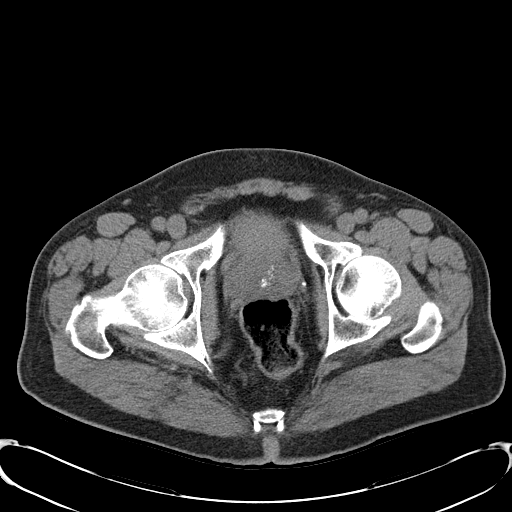
[im 25/94  soft-tissue]
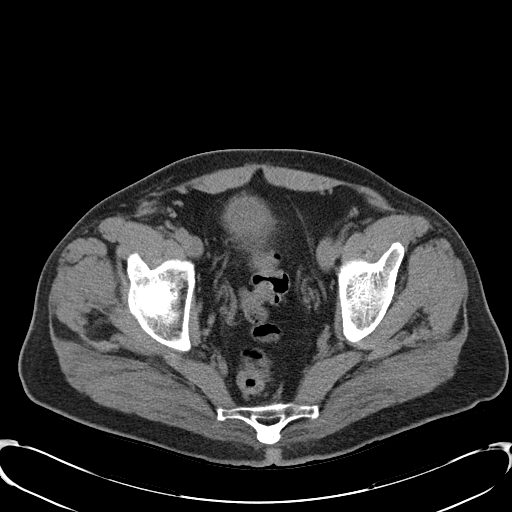
[im 31/94  soft-tissue]
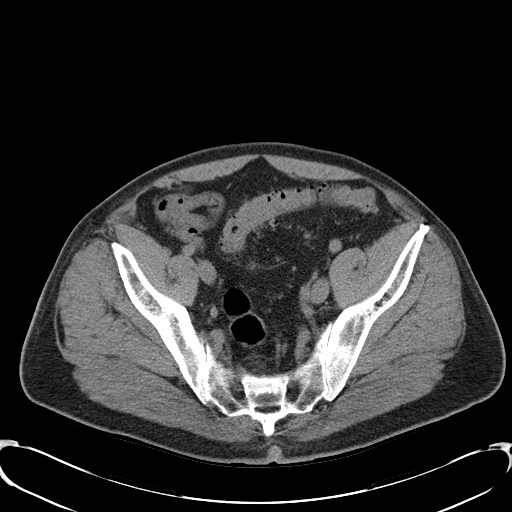
[im 37/94  soft-tissue]
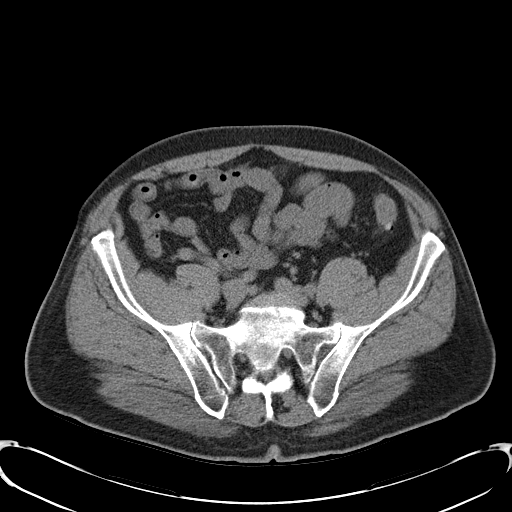
[im 43/94  soft-tissue]
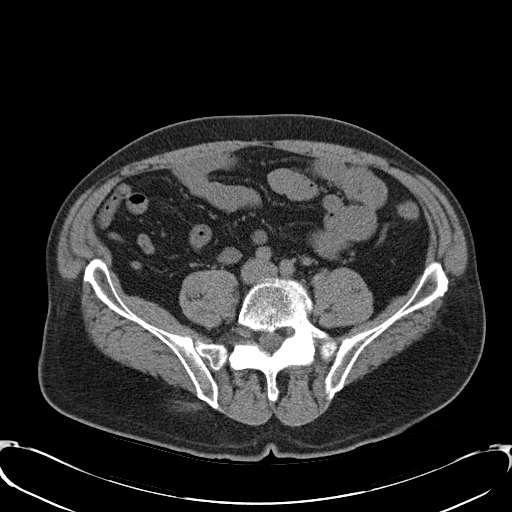
[im 52/94  soft-tissue]
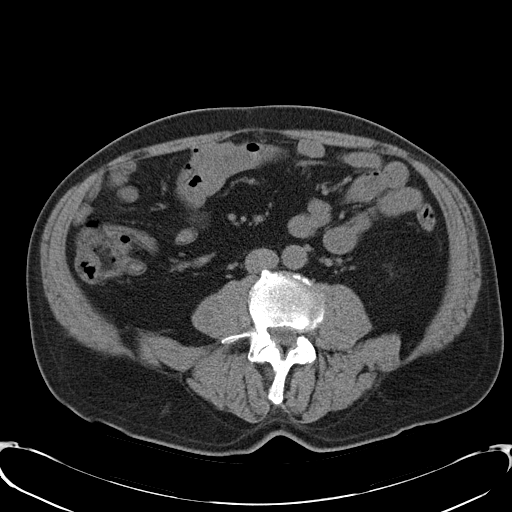
[im 58/94  soft-tissue]
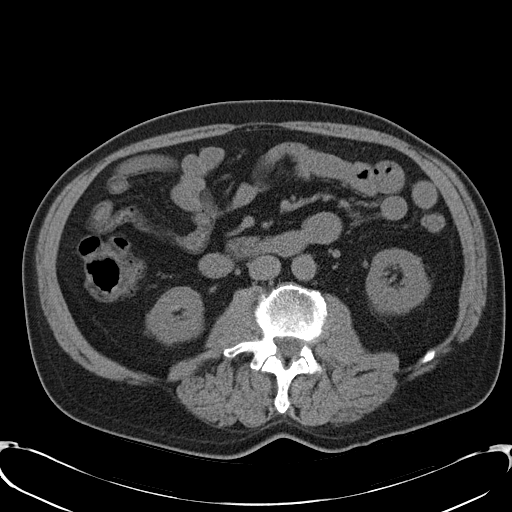
[im 58/94  bone]
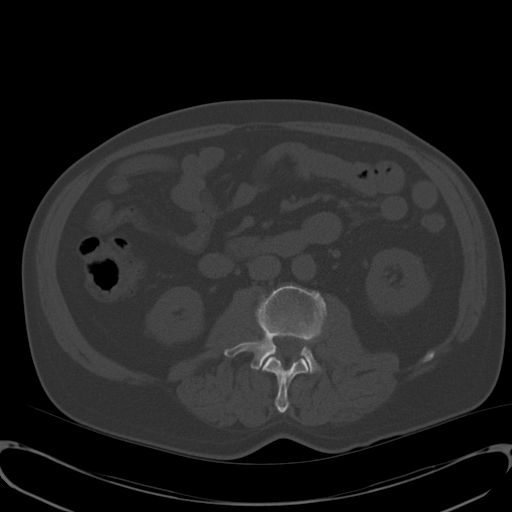
[im 64/94  soft-tissue]
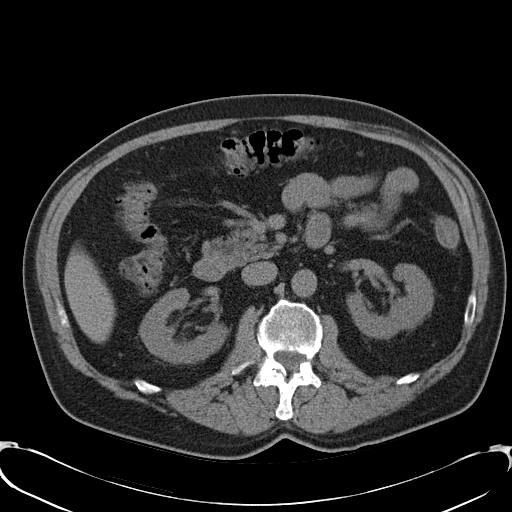
[im 70/94  soft-tissue]
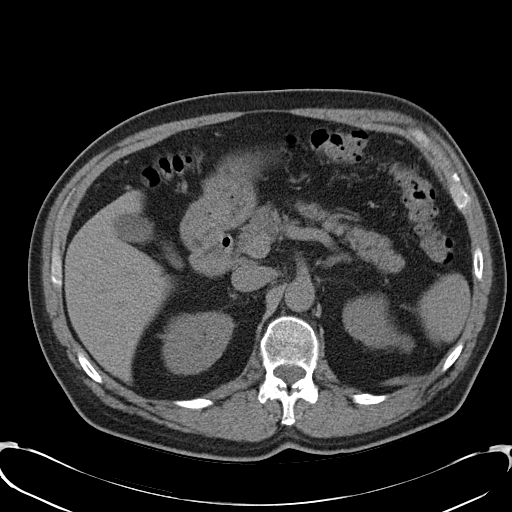
[im 76/94  soft-tissue]
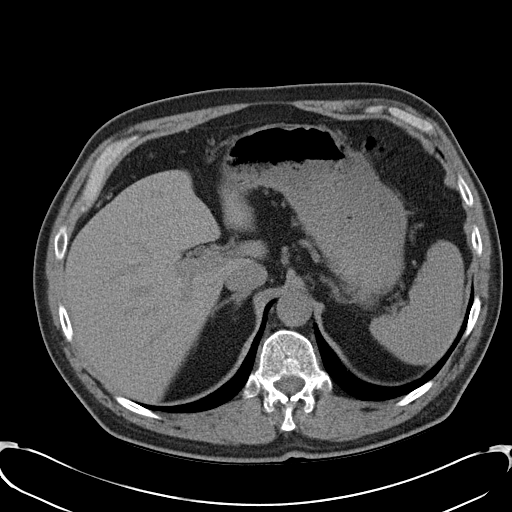
[im 82/94  soft-tissue]
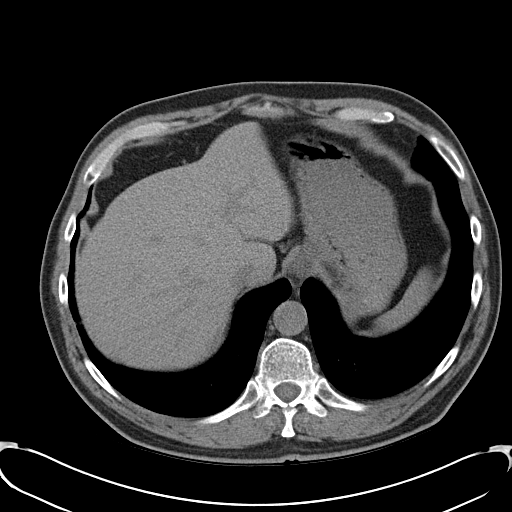
[im 88/94  soft-tissue]
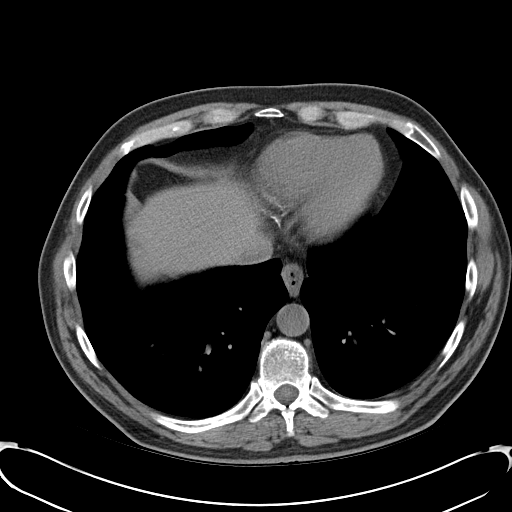

[Series 4: mpr coronal · coronal · 0.76mm/px · 3 of 89 slices shown]
[im 30/89  soft-tissue]
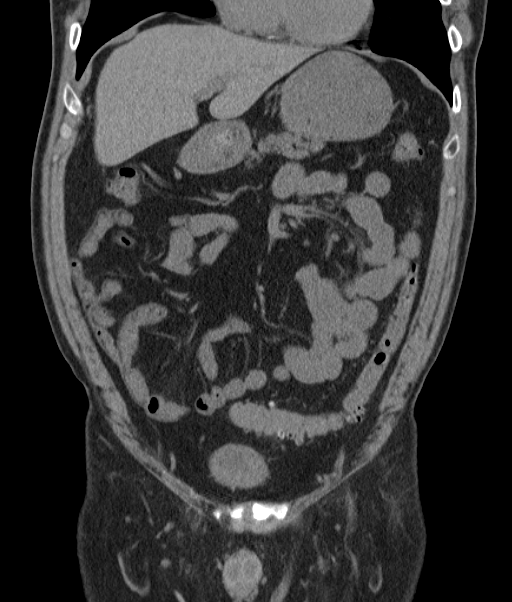
[im 40/89  soft-tissue]
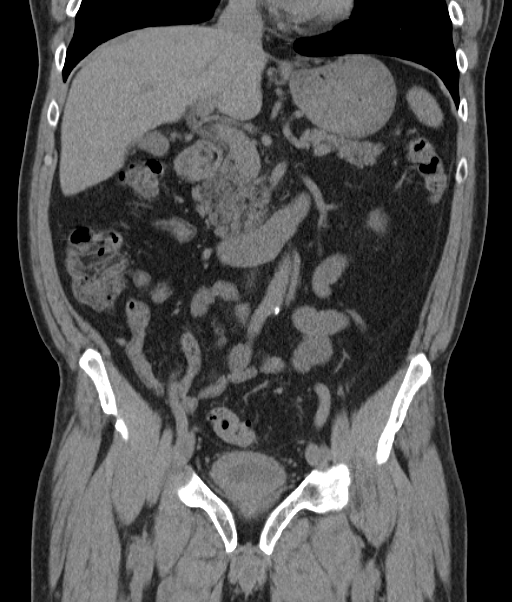
[im 49/89  soft-tissue]
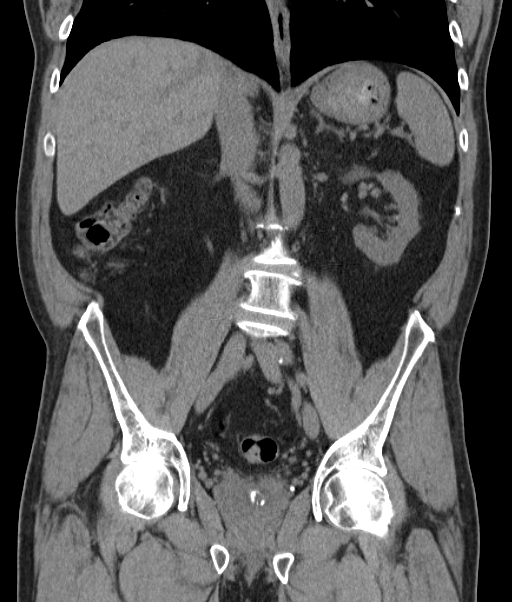

[17 of 46 positions shown; findings below may reference images not displayed]

FINDINGS: The previously visualized calculus in the proximal ureter is now in
the distal ureter, 1 cm above the ureterovesical junction. It is 4
mm. Left perinephric fluid and hydronephrosis have resolved.

Unenhanced intra-abdominal organs are otherwise stable.

Normal appendix.

Prostate enlargement.

Sigmoid diverticulosis. There is subtle stranding along the course
of the sigmoid colon in the adjacent fat. Sigmoid wall is somewhat
prominent however this may be due to decompression.

No acute bony deformity. Degenerative changes in lumbar spine are
stable.
IMPRESSION: Left ureteral calculus is now a in the distal left ureter. Secondary
findings of left ureteral obstruction of resolved.

There is sigmoid diverticulosis and equivocal findings for acute
sigmoid diverticulitis. Correlate clinically as for the presence of
diarrhea and fever.

## 2014-09-05 DIAGNOSIS — M7711 Lateral epicondylitis, right elbow: Secondary | ICD-10-CM | POA: Insufficient documentation

## 2018-06-27 DIAGNOSIS — N2889 Other specified disorders of kidney and ureter: Secondary | ICD-10-CM | POA: Insufficient documentation

## 2019-02-20 DIAGNOSIS — E785 Hyperlipidemia, unspecified: Secondary | ICD-10-CM | POA: Insufficient documentation

## 2019-02-20 DIAGNOSIS — G4733 Obstructive sleep apnea (adult) (pediatric): Secondary | ICD-10-CM | POA: Insufficient documentation

## 2019-12-09 DIAGNOSIS — R350 Frequency of micturition: Secondary | ICD-10-CM | POA: Insufficient documentation

## 2019-12-09 DIAGNOSIS — N401 Enlarged prostate with lower urinary tract symptoms: Secondary | ICD-10-CM | POA: Insufficient documentation

## 2019-12-09 DIAGNOSIS — C642 Malignant neoplasm of left kidney, except renal pelvis: Secondary | ICD-10-CM | POA: Insufficient documentation

## 2019-12-09 HISTORY — DX: Malignant neoplasm of left kidney, except renal pelvis: C64.2

## 2023-02-06 ENCOUNTER — Other Ambulatory Visit: Payer: Self-pay

## 2023-02-06 DIAGNOSIS — F419 Anxiety disorder, unspecified: Secondary | ICD-10-CM | POA: Insufficient documentation

## 2023-03-20 ENCOUNTER — Institutional Professional Consult (permissible substitution) (INDEPENDENT_AMBULATORY_CARE_PROVIDER_SITE_OTHER): Payer: No Typology Code available for payment source | Admitting: Thoracic Surgery (Cardiothoracic Vascular Surgery)

## 2023-03-20 ENCOUNTER — Encounter: Payer: Self-pay | Admitting: Thoracic Surgery (Cardiothoracic Vascular Surgery)

## 2023-03-20 VITALS — BP 152/82 | HR 55 | Resp 20 | Ht 71.0 in | Wt 167.0 lb

## 2023-03-20 DIAGNOSIS — R911 Solitary pulmonary nodule: Secondary | ICD-10-CM | POA: Diagnosis not present

## 2023-03-20 NOTE — Progress Notes (Deleted)
  HPI:  Patient returns for routine postoperative follow-up having undergone  The patient's early postoperative recovery while in the hospital was notable for Since hospital discharge the patient reports   Current Outpatient Medications  Medication Sig Dispense Refill   acetaminophen (TYLENOL) 500 MG tablet Take by mouth.     atenolol (TENORMIN) 50 MG tablet Take by mouth.     chlorthalidone (HYGROTON) 25 MG tablet Take by mouth.     losartan (COZAAR) 100 MG tablet Take 100 mg by mouth.     metoCLOPramide (REGLAN) 10 MG tablet Take 1 tablet (10 mg total) by mouth every 6 (six) hours as needed for nausea. 30 tablet 0   simvastatin (ZOCOR) 10 MG tablet Take by mouth.     tamsulosin (FLOMAX) 0.4 MG CAPS capsule Take 1 capsule (0.4 mg total) by mouth daily. 10 capsule 0   tamsulosin (FLOMAX) 0.4 MG CAPS capsule Take 1 capsule by mouth daily.     zolpidem (AMBIEN) 10 MG tablet Take by mouth.     No current facility-administered medications for this visit.    Physical Exam  Diagnostic Tests:   Impression:  Plan:   Loreli Slot, MD [facility] [phone]

## 2023-03-20 NOTE — Progress Notes (Unsigned)
PCP is Patient, No Pcp Per Referring Provider is No ref. provider found  Chief Complaint  Patient presents with   Lung Lesion    New patient consultation Chest CT 5/14, CT A/P 8/12    HPI: Ethan Torres is sent for consultation regarding a left lower lobe lung nodule  Ethan Torres is a 72 year old man with a history of hypertension, hyperlipidemia, renal cell carcinoma, left nephrectomy, sleep apnea, gout, arthritis, and a left lower lobe lung nodule dating back to 2017.  He says he was first noted to have a nodule in the left lung in 2017 and it has been followed since then.  Had a CT of the chest in May 2024 which showed a groundglass nodule with a small central solid component.  Had a CT of the abdomen and pelvis in August which noted a 1.3 cm subsolid nodule in the left lung base with a 4 mm solid component with a possible "subtle very indolent increase in conspicuity."  He is a lifelong non-smoker.  History of a left nephrectomy for renal cell carcinoma.  Has atypical right-sided chest pain after exertion at times.  Has been worked up and followed at the [facility] for that.  No shortness of breath, wheezing, hemoptysis.  Zubrod Score: At the time of surgery this patient's most appropriate activity status/level should be described as: [x]     0    Normal activity, no symptoms []     1    Restricted in physical strenuous activity but ambulatory, able to do out light work []     2    Ambulatory and capable of self care, unable to do work activities, up and about >50 % of waking hours                              []     3    Only limited self care, in bed greater than 50% of waking hours []     4    Completely disabled, no self care, confined to bed or chair []     5    Moribund  Past Medical History:  Diagnosis Date   Carcinoma of left kidney (HCC) 12/09/2019   Gout of big toe 04/02/2011   Left big toe     High cholesterol    Hypertension    Lung nodule, solitary    Renal disorder    Sleep apnea      Past Surgical History:  Procedure Laterality Date   NEPHRECTOMY Left     History reviewed. No pertinent family history.  Social History Social History   Tobacco Use   Smoking status: Never  Substance Use Topics   Alcohol use: No   Drug use: No    Current Outpatient Medications  Medication Sig Dispense Refill   acetaminophen (TYLENOL) 500 MG tablet Take by mouth.     atenolol (TENORMIN) 50 MG tablet Take by mouth.     chlorthalidone (HYGROTON) 25 MG tablet Take by mouth.     losartan (COZAAR) 100 MG tablet Take 100 mg by mouth.     metoCLOPramide (REGLAN) 10 MG tablet Take 1 tablet (10 mg total) by mouth every 6 (six) hours as needed for nausea. 30 tablet 0   simvastatin (ZOCOR) 10 MG tablet Take by mouth.     tamsulosin (FLOMAX) 0.4 MG CAPS capsule Take 1 capsule (0.4 mg total) by mouth daily. 10 capsule 0   tamsulosin (FLOMAX) 0.4 MG  CAPS capsule Take 1 capsule by mouth daily.     zolpidem (AMBIEN) 10 MG tablet Take by mouth.     No current facility-administered medications for this visit.    No Known Allergies  Review of Systems  Constitutional:  Negative for activity change, fatigue and unexpected weight change.  HENT:  Negative for trouble swallowing and voice change.   Respiratory:  Positive for apnea (CPAP at night). Negative for cough, shortness of breath and wheezing.   Cardiovascular:  Positive for chest pain (Occasional right-sided chest pain). Negative for leg swelling.  Gastrointestinal:  Positive for abdominal pain (Reflux/hiatal hernia).  Genitourinary:  Positive for frequency.       Previous nephrectomy  Musculoskeletal:  Positive for arthralgias and joint swelling.  Neurological:  Negative for seizures and weakness.  Hematological:  Negative for adenopathy. Does not bruise/bleed easily.  All other systems reviewed and are negative.   BP (!) 152/82 (BP Location: Left Arm, Patient Position: Sitting, Cuff Size: Normal)   Pulse (!) 55   Resp 20    Ht 5\' 11"  (1.803 m)   Wt 167 lb (75.8 kg)   SpO2 100% Comment: RA  BMI 23.29 kg/m  Physical Exam Vitals reviewed.  Constitutional:      General: He is not in acute distress.    Appearance: Normal appearance.  HENT:     Head: Normocephalic and atraumatic.  Eyes:     General: No scleral icterus.    Extraocular Movements: Extraocular movements intact.  Cardiovascular:     Rate and Rhythm: Normal rate and regular rhythm.     Heart sounds: Normal heart sounds. No murmur heard. Pulmonary:     Effort: No respiratory distress.     Breath sounds: Normal breath sounds. No wheezing.  Abdominal:     General: There is no distension.     Palpations: Abdomen is soft.  Musculoskeletal:     Cervical back: Neck supple.  Lymphadenopathy:     Cervical: No cervical adenopathy.  Skin:    General: Skin is warm and dry.  Neurological:     General: No focal deficit present.     Mental Status: He is alert and oriented to person, place, and time.     Cranial Nerves: No cranial nerve deficit.     Motor: No weakness.    Diagnostic Tests: I reviewed his CT of the abdomen and pelvis from August 2024 and compared it to his CT chest from May 2024.  1.3 cm subsolid nodule central basilar left lower lobe.  On the films in May there was a solid component on the soft tissue windows but not on the films in August.  Impression: Ethan Torres is a 72 year old man with a history of hypertension, hyperlipidemia, renal cell carcinoma, left nephrectomy, sleep apnea, gout, arthritis, and a left lower lobe lung nodule dating back to 2017.  Left lower lobe lung nodule-13 mm subsolid nodule adjacent to diaphragm central left lower lobe.  Differential diagnosis includes low-grade adenocarcinoma, as well as infectious or inflammatory nodules.  Interesting difference with there being a solid component in May 2024 but not in August 2024.  We discussed potential options of attempted bronchoscopic biopsy versus surgical  resection versus continued observation.  He is not anxious to have a surgical resection since.  I do not think a PET scan will be useful in this setting.  After discussion of options he wishes to continue with radiographic follow-up.  He is scheduled to have another CT and November.  I will plan to see him back after that and we can again discuss our options.  Plan: Return in November after CT at the [facility]  I spent over 30 minutes in review of records, images, and in consultation with Ethan Torres today Loreli Slot, MD [facility] [phone]

## 2023-04-25 ENCOUNTER — Other Ambulatory Visit: Payer: Self-pay

## 2023-04-25 ENCOUNTER — Ambulatory Visit: Payer: No Typology Code available for payment source | Attending: Neurosurgery | Admitting: Physical Therapy

## 2023-04-25 ENCOUNTER — Encounter: Payer: Self-pay | Admitting: Physical Therapy

## 2023-04-25 DIAGNOSIS — M542 Cervicalgia: Secondary | ICD-10-CM | POA: Insufficient documentation

## 2023-04-25 DIAGNOSIS — M62838 Other muscle spasm: Secondary | ICD-10-CM | POA: Diagnosis present

## 2023-04-25 NOTE — Therapy (Signed)
OUTPATIENT PHYSICAL THERAPY CERVICAL EVALUATION   Patient Name: Ethan Torres MRN: [id number] DOB:November 18, 1950, 72 y.o., male Today's Date: 04/25/2023  END OF SESSION:  PT End of Session - 04/25/23 1408     Visit Number 1    Number of Visits 15    Date for PT Re-Evaluation 06/20/23    Authorization Type FOTO.    PT Start Time 0100    PT Stop Time 0150    PT Time Calculation (min) 50 min    Activity Tolerance Patient tolerated treatment well    Behavior During Therapy [facility] for tasks assessed/performed             Past Medical History:  Diagnosis Date   Carcinoma of left kidney (HCC) 12/09/2019   Gout of big toe 04/02/2011   Left big toe     High cholesterol    Hypertension    Lung nodule, solitary    Renal disorder    Sleep apnea    Past Surgical History:  Procedure Laterality Date   NEPHRECTOMY Left    Patient Active Problem List   Diagnosis Date Noted   Anxiety 02/06/2023   Benign prostatic hyperplasia with incomplete bladder emptying 12/09/2019   Carcinoma of left kidney (HCC) 12/09/2019   Increased frequency of urination 12/09/2019   Hyperlipidemia 02/20/2019   OSA (obstructive sleep apnea) 02/20/2019   Renal mass 06/27/2018   Lateral epicondylitis, right elbow 09/05/2014   Insomnia 11/21/2012   Gout of big toe 04/02/2011   Chest pain 02/18/2011   Essential hypertension 02/18/2011    REFERRING PROVIDER: Lisbeth Renshaw MD  REFERRING DIAG: Cervical spondylosis.  THERAPY DIAG:  Cervicalgia - Plan: PT plan of care cert/re-cert  Other muscle spasm - Plan: PT plan of care cert/re-cert  Rationale for Evaluation and Treatment: Rehabilitation  ONSET DATE: July 2024.  SUBJECTIVE:                                                                                                                                                                                                         SUBJECTIVE STATEMENT: The patient presents to the clinic with c/o  right-sided that began in July of 2024 for no apparent reason.  He states he experiences pain in his right shoulder blade region, Tricep, forearm and thumb.  He rated his pain at a 5-6/10.  Working in yard increases his pain.  Ice, heat and rest help to decrease pain.    PERTINENT HISTORY:  Per patient report:  Prostate cancer.  H/o LBP.  He walks daily and does back  exercises and stretches.    PAIN:  Are you having pain? Yes: NPRS scale: 5-6/10 Pain location: Right cervical. Pain description: Ache, sharp. Aggravating factors: As above. Relieving factors: As above.  PRECAUTIONS: Other: No ultrasound.  No electrical stimulation.  FALLS:  Has patient fallen in last 6 months? No  LIVING ENVIRONMENT: Lives in: House/apartment Has following equipment at home: None  OCCUPATION: Retired.  Disabled.  PLOF: Independent  PATIENT GOALS: Not have the pain.  OBJECTIVE:  Note: Objective measures were completed at Evaluation unless otherwise noted.  DIAGNOSTIC FINDINGS:  MRI (03/29/23):  C4-5 disc broad based disc osteophyte and resultant moderate to severe bilateral neural foraminal stenosis. At C5-6 there is perhaps mild right-sided neural foraminal stenosis.   POSTURE: rounded shoulders and forward head  PALPATION: Tender to palpation right of C5-6, upper right medial scapular border and triceps.   CERVICAL ROM:   Active ROM AROM (deg) eval  Right lateral flexion 20  Left lateral flexion 30  Right rotation 68  Left rotation 69   (Blank rows = not tested)  UPPER EXTREMITY ROM:  WNL.  UPPER EXTREMITY MMT:  Normal UE strength.  He is right hand dominant and gave an output of 80# and 70# on left.  DTR's  Normal UE DTR's.    TODAY'S TREATMENT:                                                                                                                              DATE: HMP x 10 minutes to patient's right cervical region. Normal modality response following removal of  modality.  f/b Intermittent cervical traction at 14# (99 sec hold f/b 5 sec rest) x 15 minutes.     PATIENT EDUCATION:  Education details: Correct posture.  Chin tucks. Person educated: Patient Education method: Explanation Education comprehension: verbalized understanding  HOME EXERCISE PROGRAM:   ASSESSMENT:  CLINICAL IMPRESSION: The patient presents to [facility] with c/o right-sided neck and shoulder blade pain that began in July of 2024.  He also experiences pain into his right triceps, forearm and thumb. He is tender to palpation right of C5-6, upper right medial scapular border and triceps.  His UE DTR's are normal as is his UE strength.  He has some limitation of active cervical range of motion.  Patient will benefit from skilled physical therapy intervention to address pain and deficits.   OBJECTIVE IMPAIRMENTS: decreased activity tolerance, decreased ROM, increased muscle spasms, and pain.   ACTIVITY LIMITATIONS: carrying and lifting  PARTICIPATION LIMITATIONS: yard work  [facility] POTENTIAL: Good  CLINICAL DECISION MAKING: Evolving/moderate complexity  EVALUATION COMPLEXITY: Low   GOALS:  LONG TERM GOALS: Target date: 06/20/23.  Ind with a HEP.  Goal status: INITIAL  2.  Bilateral active cervical rotation to 75 degrees. Baseline:  Goal status: INITIAL  3.  Eliminate right UE symptoms.  Goal status: INITIAL  4.  Perform ADL's with pain not > 3/10.  Goal status: INITIAL  PLAN:  PT FREQUENCY: 2x/week  PT DURATION: other: 7-8 weeks.  PLANNED INTERVENTIONS: 97110-Therapeutic exercises, 97530- Therapeutic activity, [id number]- Neuromuscular re-education, 97535- Self Care, 29562- Manual therapy, 97012- Traction (mechanical), Patient/Family education, Dry Needling, Cryotherapy, and Moist heat  PLAN FOR NEXT SESSION: FOTO.Marland KitchenMarland KitchenChin tucks, extension and also facilitated with a towel.  Scapular therex, STW/M, intermittent cervical traction at 17#.   Berl Bonfanti, [place],  PT 04/25/2023, 2:47 PM

## 2023-05-01 ENCOUNTER — Ambulatory Visit: Payer: No Typology Code available for payment source | Admitting: *Deleted

## 2023-05-01 ENCOUNTER — Encounter: Payer: Self-pay | Admitting: *Deleted

## 2023-05-01 DIAGNOSIS — M62838 Other muscle spasm: Secondary | ICD-10-CM

## 2023-05-01 DIAGNOSIS — M542 Cervicalgia: Secondary | ICD-10-CM | POA: Diagnosis not present

## 2023-05-01 NOTE — Therapy (Addendum)
OUTPATIENT PHYSICAL THERAPY CERVICAL TREATMENT   Patient Name: Ethan Torres MRN: [id number] DOB:1950/07/02, 72 y.o., male Today's Date: 05/01/2023  END OF SESSION:  PT End of Session - 05/01/23 1328     Visit Number 2    Number of Visits 15    Date for PT Re-Evaluation 06/20/23    Authorization Type FOTO.    PT Start Time 1345    PT Stop Time 1432    PT Time Calculation (min) 47 min             Past Medical History:  Diagnosis Date   Carcinoma of left kidney (HCC) 12/09/2019   Gout of big toe 04/02/2011   Left big toe     High cholesterol    Hypertension    Lung nodule, solitary    Renal disorder    Sleep apnea    Past Surgical History:  Procedure Laterality Date   NEPHRECTOMY Left    Patient Active Problem List   Diagnosis Date Noted   Anxiety 02/06/2023   Benign prostatic hyperplasia with incomplete bladder emptying 12/09/2019   Carcinoma of left kidney (HCC) 12/09/2019   Increased frequency of urination 12/09/2019   Hyperlipidemia 02/20/2019   OSA (obstructive sleep apnea) 02/20/2019   Renal mass 06/27/2018   Lateral epicondylitis, right elbow 09/05/2014   Insomnia 11/21/2012   Gout of big toe 04/02/2011   Chest pain 02/18/2011   Essential hypertension 02/18/2011    REFERRING PROVIDER: Lisbeth Renshaw MD  REFERRING DIAG: Cervical spondylosis.  THERAPY DIAG:  Cervicalgia  Other muscle spasm  Rationale for Evaluation and Treatment: Rehabilitation  ONSET DATE: July 2024.  SUBJECTIVE:                                                                                                                                                                                                         SUBJECTIVE STATEMENT: Pt arrives today reporting doing good after last Rx with decreased RT UE symptoms   PERTINENT HISTORY:  Per patient report:  Prostate cancer.  H/o LBP.  He walks daily and does back exercises and stretches.    PAIN:  Are you having pain?  Yes: NPRS scale: 3/10 Pain location: Right cervical. Pain description: Ache, sharp. Aggravating factors: As above. Relieving factors: As above.  PRECAUTIONS: Other: No ultrasound.  No electrical stimulation.  FALLS:  Has patient fallen in last 6 months? No  LIVING ENVIRONMENT: Lives in: House/apartment Has following equipment at home: None  OCCUPATION: Retired.  Disabled.  PLOF: Independent  PATIENT GOALS: Not have the pain.  OBJECTIVE:  Note: Objective measures were completed at Evaluation unless otherwise noted.  DIAGNOSTIC FINDINGS:  MRI (03/29/23):  C4-5 disc broad based disc osteophyte and resultant moderate to severe bilateral neural foraminal stenosis. At C5-6 there is perhaps mild right-sided neural foraminal stenosis.   POSTURE: rounded shoulders and forward head  PALPATION: Tender to palpation right of C5-6, upper right medial scapular border and triceps.   CERVICAL ROM:   Active ROM AROM (deg) eval  Right lateral flexion 20  Left lateral flexion 30  Right rotation 68  Left rotation 69   (Blank rows = not tested)  UPPER EXTREMITY ROM:  WNL.  UPPER EXTREMITY MMT:  Normal UE strength.  He is right hand dominant and gave an output of 80# and 70# on left.  DTR's  Normal UE DTR's.    TODAY'S TREATMENT:                                                                                                                              DATE:    05-01-23 Reviewed chin tuck with towel roll x 10 Manual STW/ TPR to Bil. Utraps, Levator, and cervical paras  Intermittent cervical traction at 17# (99 sec hold f/b 5 sec rest) x 15 minutes.     PATIENT EDUCATION:  Education details: Correct posture.  Chin tucks. Person educated: Patient Education method: Explanation Education comprehension: verbalized understanding  HOME EXERCISE PROGRAM:   ASSESSMENT:  CLINICAL IMPRESSION:FOTO performed The patient arrived today reporting feeling better after last Rx with  decreased pain. Rx focused on STW/TPR to Bil. Utraps and cerv. Paras f/b intermittent cerv. Traction performed at 17#s today and tolerated well.  No more traction after today due to Insurance.    OBJECTIVE IMPAIRMENTS: decreased activity tolerance, decreased ROM, increased muscle spasms, and pain.   ACTIVITY LIMITATIONS: carrying and lifting  PARTICIPATION LIMITATIONS: yard work  [facility] POTENTIAL: Good  CLINICAL DECISION MAKING: Evolving/moderate complexity  EVALUATION COMPLEXITY: Low   GOALS:  LONG TERM GOALS: Target date: 06/20/23.  Ind with a HEP.  Goal status: INITIAL  2.  Bilateral active cervical rotation to 75 degrees. Baseline:  Goal status: INITIAL  3.  Eliminate right UE symptoms.  Goal status: INITIAL  4.  Perform ADL's with pain not > 3/10.  Goal status: INITIAL  PLAN:  PT FREQUENCY: 2x/week  PT DURATION: other: 7-8 weeks.  PLANNED INTERVENTIONS: 97110-Therapeutic exercises, 97530- Therapeutic activity, [id number]- Neuromuscular re-education, 97535- Self Care, 78295- Manual therapy, 97012- Traction (mechanical), Patient/Family education, Dry Needling, Cryotherapy, and Moist heat  PLAN FOR NEXT SESSION: .Chin tucks, extension and also facilitated with a towel.  Scapular therex, STW/M,    No more modalities or traction after today.  Dorlis Judice,CHRIS, PTA 05/01/2023, 2:36 PM

## 2023-05-10 ENCOUNTER — Ambulatory Visit: Payer: No Typology Code available for payment source | Attending: Neurosurgery | Admitting: *Deleted

## 2023-05-10 DIAGNOSIS — M542 Cervicalgia: Secondary | ICD-10-CM | POA: Insufficient documentation

## 2023-05-10 DIAGNOSIS — M62838 Other muscle spasm: Secondary | ICD-10-CM | POA: Insufficient documentation

## 2023-05-10 NOTE — Therapy (Signed)
OUTPATIENT PHYSICAL THERAPY CERVICAL TREATMENT   Patient Name: Ethan Torres MRN: [id number] DOB:Jul 20, 1950, 72 y.o., male Today's Date: 05/10/2023  END OF SESSION:  PT End of Session - 05/10/23 1255     Visit Number 3    Number of Visits 15    Date for PT Re-Evaluation 06/20/23    Authorization Type FOTO.    PT Start Time 1300    PT Stop Time 1349    PT Time Calculation (min) 49 min             Past Medical History:  Diagnosis Date   Carcinoma of left kidney (HCC) 12/09/2019   Gout of big toe 04/02/2011   Left big toe     High cholesterol    Hypertension    Lung nodule, solitary    Renal disorder    Sleep apnea    Past Surgical History:  Procedure Laterality Date   NEPHRECTOMY Left    Patient Active Problem List   Diagnosis Date Noted   Anxiety 02/06/2023   Benign prostatic hyperplasia with incomplete bladder emptying 12/09/2019   Carcinoma of left kidney (HCC) 12/09/2019   Increased frequency of urination 12/09/2019   Hyperlipidemia 02/20/2019   OSA (obstructive sleep apnea) 02/20/2019   Renal mass 06/27/2018   Lateral epicondylitis, right elbow 09/05/2014   Insomnia 11/21/2012   Gout of big toe 04/02/2011   Chest pain 02/18/2011   Essential hypertension 02/18/2011    REFERRING PROVIDER: Lisbeth Renshaw MD  REFERRING DIAG: Cervical spondylosis.  THERAPY DIAG:  Cervicalgia  Other muscle spasm  Rationale for Evaluation and Treatment: Rehabilitation  ONSET DATE: July 2024.  SUBJECTIVE:                                                                                                                                                                                                         SUBJECTIVE STATEMENT: Pt arrives today reporting doing better after last Rx.   PERTINENT HISTORY:  Per patient report:  Prostate cancer.  H/o LBP.  He walks daily and does back exercises and stretches.    PAIN:  Are you having pain? Yes: NPRS scale: 3/10 Pain  location: Right cervical. Pain description: Ache, sharp. Aggravating factors: As above. Relieving factors: As above.  PRECAUTIONS: Other: No ultrasound.  No electrical stimulation.  FALLS:  Has patient fallen in last 6 months? No  LIVING ENVIRONMENT: Lives in: House/apartment Has following equipment at home: None  OCCUPATION: Retired.  Disabled.  PLOF: Independent  PATIENT GOALS: Not have the pain.  OBJECTIVE:  Note: Objective  measures were completed at Evaluation unless otherwise noted.  DIAGNOSTIC FINDINGS:  MRI (03/29/23):  C4-5 disc broad based disc osteophyte and resultant moderate to severe bilateral neural foraminal stenosis. At C5-6 there is perhaps mild right-sided neural foraminal stenosis.   POSTURE: rounded shoulders and forward head  PALPATION: Tender to palpation right of C5-6, upper right medial scapular border and triceps.   CERVICAL ROM:   Active ROM AROM (deg) eval  Right lateral flexion 20  Left lateral flexion 30  Right rotation 68  Left rotation 69   (Blank rows = not tested)  UPPER EXTREMITY ROM:  WNL.  UPPER EXTREMITY MMT:  Normal UE strength.  He is right hand dominant and gave an output of 80# and 70# on left.  DTR's  Normal UE DTR's.    TODAY'S TREATMENT:                                                                                                                              DATE:    05-10-23  Manual STW/ TPR to RT. side Utraps, Levator, and cervical paras  f/b  manual traction and PROM for rotation      PATIENT EDUCATION:  Education details: Correct posture.  Chin tucks. Person educated: Patient Education method: Explanation Education comprehension: verbalized understanding  HOME EXERCISE PROGRAM:   ASSESSMENT:  CLINICAL IMPRESSION: The patient arrived today reporting feeling better after last Rx with decreased pain.Rx again on STW/TPR to Bil. Utraps and cerv. Paras f/b  manual cerv. Traction performed and was  tolerated well.  Pt was informed that insurance will no longer cover mechanical traction.    OBJECTIVE IMPAIRMENTS: decreased activity tolerance, decreased ROM, increased muscle spasms, and pain.   ACTIVITY LIMITATIONS: carrying and lifting  PARTICIPATION LIMITATIONS: yard work  [facility] POTENTIAL: Good  CLINICAL DECISION MAKING: Evolving/moderate complexity  EVALUATION COMPLEXITY: Low   GOALS:  LONG TERM GOALS: Target date: 06/20/23.  Ind with a HEP.  Goal status: INITIAL  2.  Bilateral active cervical rotation to 75 degrees. Baseline:  Goal status: INITIAL  3.  Eliminate right UE symptoms.  Goal status: INITIAL  4.  Perform ADL's with pain not > 3/10.  Goal status: INITIAL  PLAN:  PT FREQUENCY: 2x/week  PT DURATION: other: 7-8 weeks.  PLANNED INTERVENTIONS: 97110-Therapeutic exercises, 97530- Therapeutic activity, [id number]- Neuromuscular re-education, 97535- Self Care, 16109- Manual therapy, 97012- Traction (mechanical), Patient/Family education, Dry Needling, Cryotherapy, and Moist heat  PLAN FOR NEXT SESSION: .Chin tucks, extension and also facilitated with a towel.  Scapular therex, STW/M,    No more modalities or traction  Karry Barrilleaux,CHRIS, PTA 05/10/2023, 1:54 PM

## 2023-05-16 ENCOUNTER — Ambulatory Visit: Payer: No Typology Code available for payment source | Admitting: *Deleted

## 2023-05-16 ENCOUNTER — Encounter: Payer: Self-pay | Admitting: *Deleted

## 2023-05-16 DIAGNOSIS — M62838 Other muscle spasm: Secondary | ICD-10-CM

## 2023-05-16 DIAGNOSIS — M542 Cervicalgia: Secondary | ICD-10-CM | POA: Diagnosis not present

## 2023-05-16 NOTE — Therapy (Signed)
OUTPATIENT PHYSICAL THERAPY CERVICAL TREATMENT   Patient Name: Ethan Torres MRN: [id number] DOB:[birth year], 72 y.o., male Today's Date: 05/16/2023  END OF SESSION:  PT End of Session - 05/16/23 1248     Visit Number 4    Number of Visits 15    Date for PT Re-Evaluation 06/20/23    Authorization Type FOTO.    PT Start Time 1300    PT Stop Time 1347    PT Time Calculation (min) 47 min             Past Medical History:  Diagnosis Date   Carcinoma of left kidney (HCC) 12/09/2019   Gout of big toe 04/02/2011   Left big toe     High cholesterol    Hypertension    Lung nodule, solitary    Renal disorder    Sleep apnea    Past Surgical History:  Procedure Laterality Date   NEPHRECTOMY Left    Patient Active Problem List   Diagnosis Date Noted   Anxiety 02/06/2023   Benign prostatic hyperplasia with incomplete bladder emptying 12/09/2019   Carcinoma of left kidney (HCC) 12/09/2019   Increased frequency of urination 12/09/2019   Hyperlipidemia 02/20/2019   OSA (obstructive sleep apnea) 02/20/2019   Renal mass 06/27/2018   Lateral epicondylitis, right elbow 09/05/2014   Insomnia 11/21/2012   Gout of big toe 04/02/2011   Chest pain 02/18/2011   Essential hypertension 02/18/2011    REFERRING PROVIDER: Lisbeth Renshaw MD  REFERRING DIAG: Cervical spondylosis.  THERAPY DIAG:  Cervicalgia  Other muscle spasm  Rationale for Evaluation and Treatment: Rehabilitation  ONSET DATE: July 2024.  SUBJECTIVE:                                                                                                                                                                                                         SUBJECTIVE STATEMENT: Pt arrives today reporting doing better after last Rx. 3/10 today. Doing better over all   PERTINENT HISTORY:  Per patient report:  Prostate cancer.  H/o LBP.  He walks daily and does back exercises and stretches.    PAIN:  Are you  having pain? Yes: NPRS scale: 3/10 Pain location: Right cervical. Pain description: Ache, sharp. Aggravating factors: As above. Relieving factors: As above.  PRECAUTIONS: Other: No ultrasound.  No electrical stimulation.  FALLS:  Has patient fallen in last 6 months? No  LIVING ENVIRONMENT: Lives in: House/apartment Has following equipment at home: None  OCCUPATION: Retired.  Disabled.  PLOF: Independent  PATIENT GOALS: Not have the  pain.  OBJECTIVE:  Note: Objective measures were completed at Evaluation unless otherwise noted.  DIAGNOSTIC FINDINGS:  MRI (03/29/23):  C4-5 disc broad based disc osteophyte and resultant moderate to severe bilateral neural foraminal stenosis. At C5-6 there is perhaps mild right-sided neural foraminal stenosis.   POSTURE: rounded shoulders and forward head  PALPATION: Tender to palpation right of C5-6, upper right medial scapular border and triceps.   CERVICAL ROM:   Active ROM AROM (deg) eval  Right lateral flexion 20  Left lateral flexion 30  Right rotation 68  Left rotation 69   (Blank rows = not tested)  UPPER EXTREMITY ROM:  WNL.  UPPER EXTREMITY MMT:  Normal UE strength.  He is right hand dominant and gave an output of 80# and 70# on left.  DTR's  Normal UE DTR's.    TODAY'S TREATMENT:                                                                                                                              DATE:    05-16-23 UBE x 6 mins  @120RPMs  H-ABD  red 3x10 Rows green 3x10 Shldr Ext 3x10 Manual STW/ TPR to RT. side Utrap, Levator, and cervical paras with pin and stretch technique used     PATIENT EDUCATION:  Education details: Correct posture.  Chin tucks. Person educated: Patient Education method: Explanation Education comprehension: verbalized understanding  HOME EXERCISE PROGRAM:   ASSESSMENT:  CLINICAL IMPRESSION: The patient arrived today reporting feeling better after last Rx 3/10 RT side neck/  Utrap. Marland KitchenRx  focused on posture exs with  UBE , red and green tband exs , as well as  STW/TPR to RT side Utrap and cerv. Paras.. Handout and bands given for HEP. 2-3 more visits then DC?    OBJECTIVE IMPAIRMENTS: decreased activity tolerance, decreased ROM, increased muscle spasms, and pain.   ACTIVITY LIMITATIONS: carrying and lifting  PARTICIPATION LIMITATIONS: yard work  [facility] POTENTIAL: Good  CLINICAL DECISION MAKING: Evolving/moderate complexity  EVALUATION COMPLEXITY: Low   GOALS:  LONG TERM GOALS: Target date: 06/20/23.  Ind with a HEP.  Goal status: INITIAL  2.  Bilateral active cervical rotation to 75 degrees. Baseline:  Goal status: INITIAL  3.  Eliminate right UE symptoms.  Goal status: INITIAL  4.  Perform ADL's with pain not > 3/10.  Goal status: INITIAL  PLAN:  PT FREQUENCY: 2x/week  PT DURATION: other: 7-8 weeks.  PLANNED INTERVENTIONS: 97110-Therapeutic exercises, 97530- Therapeutic activity, [id number]- Neuromuscular re-education, 97535- Self Care, 94854- Manual therapy, 97012- Traction (mechanical), Patient/Family education, Dry Needling, Cryotherapy, and Moist heat  PLAN FOR NEXT SESSION: .Chin tucks, extension and also facilitated with a towel.  Scapular therex, STW/M,    No more modalities or traction  Birgitta Uhlir,CHRIS, PTA 05/16/2023, 2:03 PM

## 2023-05-18 ENCOUNTER — Encounter: Payer: Self-pay | Admitting: *Deleted

## 2023-05-18 ENCOUNTER — Ambulatory Visit: Payer: No Typology Code available for payment source | Admitting: *Deleted

## 2023-05-18 DIAGNOSIS — M542 Cervicalgia: Secondary | ICD-10-CM

## 2023-05-18 DIAGNOSIS — M62838 Other muscle spasm: Secondary | ICD-10-CM

## 2023-05-18 NOTE — Therapy (Signed)
OUTPATIENT PHYSICAL THERAPY CERVICAL TREATMENT   Patient Name: Ethan Torres MRN: [id number] DOB:[birth year], 72 y.o., male Today's Date: 05/18/2023  END OF SESSION:  PT End of Session - 05/18/23 0925     Visit Number 5    Number of Visits 15    Date for PT Re-Evaluation 06/20/23    Authorization Type FOTO.    PT Start Time 0930    PT Stop Time 1010    PT Time Calculation (min) 40 min             Past Medical History:  Diagnosis Date   Carcinoma of left kidney (HCC) 12/09/2019   Gout of big toe 04/02/2011   Left big toe     High cholesterol    Hypertension    Lung nodule, solitary    Renal disorder    Sleep apnea    Past Surgical History:  Procedure Laterality Date   NEPHRECTOMY Left    Patient Active Problem List   Diagnosis Date Noted   Anxiety 02/06/2023   Benign prostatic hyperplasia with incomplete bladder emptying 12/09/2019   Carcinoma of left kidney (HCC) 12/09/2019   Increased frequency of urination 12/09/2019   Hyperlipidemia 02/20/2019   OSA (obstructive sleep apnea) 02/20/2019   Renal mass 06/27/2018   Lateral epicondylitis, right elbow 09/05/2014   Insomnia 11/21/2012   Gout of big toe 04/02/2011   Chest pain 02/18/2011   Essential hypertension 02/18/2011    REFERRING PROVIDER: Lisbeth Renshaw MD  REFERRING DIAG: Cervical spondylosis.  THERAPY DIAG:  Cervicalgia  Other muscle spasm  Rationale for Evaluation and Treatment: Rehabilitation  ONSET DATE: July 2024.  SUBJECTIVE:                                                                                                                                                                                                         SUBJECTIVE STATEMENT: Pt arrives today reporting doing better after last Rx. 3/10 today. Doing better over all. Traction really helped. Dc to HEP   PERTINENT HISTORY:  Per patient report:  Prostate cancer.  H/o LBP.  He walks daily and does back exercises and  stretches.    PAIN:  Are you having pain? Yes: NPRS scale: 3/10 Pain location: Right cervical. Pain description: Ache, sharp. Aggravating factors: As above. Relieving factors: As above.  PRECAUTIONS: Other: No ultrasound.  No electrical stimulation.  FALLS:  Has patient fallen in last 6 months? No  LIVING ENVIRONMENT: Lives in: House/apartment Has following equipment at home: None  OCCUPATION: Retired.  Disabled.  PLOF: Independent  PATIENT GOALS: Not have the pain.  OBJECTIVE:  Note: Objective measures were completed at Evaluation unless otherwise noted.  DIAGNOSTIC FINDINGS:  MRI (03/29/23):  C4-5 disc broad based disc osteophyte and resultant moderate to severe bilateral neural foraminal stenosis. At C5-6 there is perhaps mild right-sided neural foraminal stenosis.   POSTURE: rounded shoulders and forward head  PALPATION: Tender to palpation right of C5-6, upper right medial scapular border and triceps.   CERVICAL ROM:   Active ROM AROM (deg) eval  Right lateral flexion 20  Left lateral flexion 30  Right rotation 68  Left rotation 69   (Blank rows = not tested)  UPPER EXTREMITY ROM:  WNL.  UPPER EXTREMITY MMT:  Normal UE strength.  He is right hand dominant and gave an output of 80# and 70# on left.  DTR's  Normal UE DTR's.    TODAY'S TREATMENT:                                                                                                                              DATE:    05-18-23 UBE x 6 mins  @120RPMs  H-ABD  Green 3x10 Rows green 3x10 Shldr Ext 3x10 Manual STW/ TPR to RT. side Utrap, Levator, and cervical paras with pin and stretch technique used     PATIENT EDUCATION:  Education details: Correct posture.  Chin tucks. Person educated: Patient Education method: Explanation Education comprehension: verbalized understanding  HOME EXERCISE PROGRAM:   ASSESSMENT:  CLINICAL IMPRESSION:FOTO DC The patient arrived today reporting  feeling better after last Rx 3/10 RT side neck/ Utrap, but LB is hurting today. Rx  focused on posture exs  again with  UBE , red and green tband exs. Manual   STW/TPR to RT side Utrap and cerv. Paras.. All LTGs met except LTG# 4 due to pain 5/10.     OBJECTIVE IMPAIRMENTS: decreased activity tolerance, decreased ROM, increased muscle spasms, and pain.   ACTIVITY LIMITATIONS: carrying and lifting  PARTICIPATION LIMITATIONS: yard work  [facility] POTENTIAL: Good  CLINICAL DECISION MAKING: Evolving/moderate complexity  EVALUATION COMPLEXITY: Low   GOALS:  LONG TERM GOALS: Target date: 06/20/23.  Ind with a HEP.  Goal status: MET  2.  Bilateral active cervical rotation to 75 degrees. Baseline:  Goal status: MET       3.  Eliminate right UE symptoms.  Goal status: MET  4.  Perform ADL's with pain not > 3/10.  Goal status: NM 5/10  PLAN:  PT FREQUENCY: 2x/week  PT DURATION: other: 7-8 weeks.  PLANNED INTERVENTIONS: 97110-Therapeutic exercises, 97530- Therapeutic activity, [id number]- Neuromuscular re-education, 97535- Self Care, 27253- Manual therapy, 97012- Traction (mechanical), Patient/Family education, Dry Needling, Cryotherapy, and Moist heat  PLAN FOR NEXT SESSION:  DC to HEP  Nirvan Laban,CHRIS, PTA 05/18/2023, 10:10 AM

## 2023-05-22 ENCOUNTER — Ambulatory Visit: Payer: TRICARE For Life (TFL) | Admitting: Thoracic Surgery (Cardiothoracic Vascular Surgery)

## 2023-05-28 ENCOUNTER — Ambulatory Visit: Payer: No Typology Code available for payment source | Admitting: Thoracic Surgery (Cardiothoracic Vascular Surgery)

## 2023-05-28 VITALS — BP 142/84 | HR 83 | Resp 20 | Ht 71.0 in | Wt 164.0 lb

## 2023-05-28 DIAGNOSIS — R911 Solitary pulmonary nodule: Secondary | ICD-10-CM | POA: Diagnosis not present

## 2023-05-28 NOTE — Progress Notes (Signed)
[address]       [name] [address]             [phone]     HPI: Ethan Torres returns for follow-up of a groundglass opacity in the left lower lobe.  Ethan Torres is a 72 year old non-smoker with a history of hypertension, hyperlipidemia, renal cell carcinoma, left nephrectomy, sleep apnea, gout, arthritis, and a left lower lobe lung nodule.  First noted to have a groundglass opacity in the left lower lobe in 2017.  Has been followed since then.  CT in May 2024 showed a groundglass nodule with a small central component.  CT of the abdomen pelvis in August showed a 1.3 cm subsolid nodule with a 4 mm solid component with a possible "subtle very indolent increase in conspicuity."  I saw him in October.  We discussed attempting to biopsy that or resected surgically.  He respectfully denied that and wanted to continue with radiographic follow-up.  He had a CT at the [facility] a couple of weeks ago and now returns to discuss the results.  Overall has been feeling well.  No chest pain, pressure, tightness, shortness of breath, cough or wheezing.  He does have some back pain that is giving him problems.  This Past Medical History:  Diagnosis Date   Carcinoma of left kidney (HCC) 12/09/2019   Gout of big toe 04/02/2011   Left big toe     High cholesterol    Hypertension    Lung nodule, solitary    Renal disorder    Sleep apnea     Current Outpatient Medications  Medication Sig Dispense Refill   acetaminophen (TYLENOL) 500 MG tablet Take by mouth.     atenolol (TENORMIN) 50 MG tablet Take by mouth.     chlorthalidone (HYGROTON) 25 MG tablet Take by mouth.     losartan (COZAAR) 100 MG tablet Take 100 mg by mouth.     metoCLOPramide (REGLAN) 10 MG tablet Take 1 tablet (10 mg total) by mouth every 6 (six) hours as needed for nausea. 30 tablet 0   simvastatin (ZOCOR) 10 MG tablet Take by mouth.     tamsulosin (FLOMAX) 0.4 MG CAPS capsule Take 1 capsule (0.4  mg total) by mouth daily. 10 capsule 0   tamsulosin (FLOMAX) 0.4 MG CAPS capsule Take 1 capsule by mouth daily.     zolpidem (AMBIEN) 10 MG tablet Take by mouth.     No current facility-administered medications for this visit.    Physical Exam  Diagnostic Tests: CT chest [facility] 05/15/2023 Official impression 1.  Left lower lobe subsolid nodule measures 16 x 9 mm compared to 13 x 9 mm on 10/17/2022.  Solid component measures 5 x 3 mm.  Recommend 49-month unenhanced chest CT follow-up. 2.  Other scattered tiny pulmonary nodules have shown long-term stability and are considered benign with no further follow-up recommended.  Electronically signed by Sela Hua, MD 05/15/2023 9:22 AM  I personally reviewed the CT images.  I do not have his previous films for comparison.  Agree with findings were approximately 16 x 9 groundglass opacity central left lower lobe just above the diaphragm.  Do not really see any solid component on the abdominal windows but would defer to radiology with their superior monitors.  Impression: Ethan Torres is a 72 year old non-smoker with a history of hypertension, hyperlipidemia, renal cell carcinoma, left nephrectomy, sleep apnea, gout, arthritis, and a left lower lobe lung nodule.  Left lower lobe lung nodule-by report there is been an increase in the size of the groundglass component relatively stable solid component.  Concern is obviously for a low-grade adenocarcinoma.  No way to either rule that in or out definitively based on these images.  This would be a difficult nodule to biopsy.  Not really candidate for CT-guided biopsy due to its central location.  Also difficult to biopsy with navigational bronchoscopy due to peripheral basilar location.  Again discussed the option of surgical resection for definitive diagnosis and treatment versus continued radiographic observation.  He understands that being a non-smoker does not rule out the possibility of  lung cancer is at least 15% of lung cancers occur in non-smokers.  And he still wishes to continue with radiographic observation.  He understands the risks involved.  He has an appointment to see a pulmonologist at the [facility] in February.  If he wants to follow-up with them, that would be fine with me as long as there is no loss of continuity of care.  I will have him see me back in 3 months with a CT.  Plan: Return in 3 months after CT chest  Loreli Slot, MD [facility] [phone]

## 2023-06-18 ENCOUNTER — Other Ambulatory Visit (HOSPITAL_COMMUNITY): Payer: Self-pay | Admitting: Urology

## 2023-06-18 DIAGNOSIS — C61 Malignant neoplasm of prostate: Secondary | ICD-10-CM

## 2023-06-23 ENCOUNTER — Ambulatory Visit (HOSPITAL_COMMUNITY)
Admission: RE | Admit: 2023-06-23 | Discharge: 2023-06-23 | Disposition: A | Discharge status: Home / Self Care | Payer: No Typology Code available for payment source | Source: Ambulatory Visit | Attending: Urology | Admitting: Urology

## 2023-06-23 DIAGNOSIS — C61 Malignant neoplasm of prostate: Secondary | ICD-10-CM | POA: Diagnosis present

## 2023-06-23 MED ORDER — GADOBUTROL 1 MMOL/ML IV SOLN
10.0000 mL | Freq: Once | INTRAVENOUS | Status: AC | PRN
  Administered 2023-06-23: 10 mL via INTRAVENOUS

## 2023-06-30 ENCOUNTER — Other Ambulatory Visit (HOSPITAL_COMMUNITY): Payer: Medicare Other

## 2023-07-13 ENCOUNTER — Other Ambulatory Visit: Payer: Self-pay | Admitting: Thoracic Surgery (Cardiothoracic Vascular Surgery)

## 2023-07-13 DIAGNOSIS — R911 Solitary pulmonary nodule: Secondary | ICD-10-CM

## 2023-08-21 ENCOUNTER — Other Ambulatory Visit: Payer: Medicare Other

## 2023-08-21 ENCOUNTER — Ambulatory Visit: Payer: Medicare Other | Admitting: Thoracic Surgery (Cardiothoracic Vascular Surgery)

## 2023-09-04 ENCOUNTER — Ambulatory Visit: Payer: Medicare Other | Admitting: Thoracic Surgery (Cardiothoracic Vascular Surgery)

## 2023-11-20 ENCOUNTER — Ambulatory Visit
Attending: Thoracic Surgery (Cardiothoracic Vascular Surgery) | Admitting: Thoracic Surgery (Cardiothoracic Vascular Surgery)

## 2023-11-20 VITALS — BP 138/74 | HR 60 | Resp 20 | Ht 71.0 in | Wt 164.0 lb

## 2023-11-20 DIAGNOSIS — R911 Solitary pulmonary nodule: Secondary | ICD-10-CM

## 2023-11-20 NOTE — Progress Notes (Signed)
 [address]       Ethan [place] [address]             [phone]      HPI: Ethan Torres returns for follow-up of a left lower lobe lung nodule.  Ethan Torres is a 73 year old non-smoker with a history of hypertension, hyperlipidemia, renal cell carcinoma, left nephrectomy, sleep apnea, gout, arthritis, and a left lower lobe lung nodule.  Found to have a groundglass opacity in the left lower lobe in 2017.  He has been followed since that time.  In May 2004 there was a small solid component centrally.  I saw him in October 2024 and then again in December 2024 and recommended surgery both times.  He did not want to undergo surgery or attempt a biopsy but wanted to continue with radiographic follow-up.  In the interim since his last visit he has been feeling well.  No chest pain, pressure, tightness, shortness of breath.  Past Medical History:  Diagnosis Date   Carcinoma of left kidney (HCC) 12/09/2019   Gout of big toe 04/02/2011   Left big toe     High cholesterol    Hypertension    Lung nodule, solitary    Renal disorder    Sleep apnea     Current Outpatient Medications  Medication Sig Dispense Refill   acetaminophen  (TYLENOL ) 500 MG tablet Take by mouth.     atenolol (TENORMIN) 50 MG tablet Take by mouth.     chlorthalidone (HYGROTON) 25 MG tablet Take by mouth.     cholecalciferol (VITAMIN D3) 25 MCG (1000 UNIT) tablet Take 1,000 Units by mouth daily.     losartan (COZAAR) 100 MG tablet Take 100 mg by mouth.     metoCLOPramide  (REGLAN ) 10 MG tablet Take 1 tablet (10 mg total) by mouth every 6 (six) hours as needed for nausea. 30 tablet 0   simvastatin (ZOCOR) 10 MG tablet Take by mouth.     tamsulosin  (FLOMAX ) 0.4 MG CAPS capsule Take 1 capsule by mouth daily.     zolpidem (AMBIEN) 10 MG tablet Take by mouth.     tamsulosin  (FLOMAX ) 0.4 MG CAPS capsule Take 1 capsule (0.4 mg total) by mouth daily. (Patient not taking: Reported on 11/20/2023) 10 capsule 0   No  current facility-administered medications for this visit.    Physical Exam BP 138/74   Pulse 60   Resp 20   Ht 5' 11 (1.803 m)   Wt 164 lb (74.4 kg)   SpO2 96% Comment: RA  BMI 22.80 kg/m  73 year old man in no acute distress Alert and oriented x 3 with no focal deficits Lungs clear bilaterally No cervical or sublingual adenopathy Cardiac regular rate and rhythm  Diagnostic Tests: I personally reviewed the CT images of the scan done at the [facility] on 10/25/2023.  16 x 9 mm groundglass opacity with approximately a 5 x 3 mm solid component.  Not significantly changed from December 2024 the increased in size compared to May 2024 and October 2023.  No mediastinal or hilar adenopathy.  Impression: Ethan Torres is a 73 year old non-smoker with a history of hypertension, hyperlipidemia, renal cell carcinoma, left nephrectomy, sleep apnea, gout, arthritis, and a left lower lobe lung nodule.  Left lower lobe lung nodule-did not look groundglass with a central solid component.  Mixed density nodule that has increased in size over time particular dating back to October 2023.  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  I discussed again with Ethan Torres that my recommendation would be to remove this surgically for definitive diagnosis and treatment.  Second alternative would be to do a biopsy although technically can be difficult with basilar lesions.  He does not want to pursue any invasive biopsy or resection at this time.  I was quite frank with them that I disagree with continued radiographic follow-up.  I think this is most likely a early stage lung cancer and is potentially curable.  He has specifically about the length of time that it would be safe to wait.  I told him that there is no safe waiting period, and it should be dealt with now.  He wants to continue with radiographic follow-up.  Plan: Return in 6 months with CT chest  I  spent over 20 minutes in review of records, images, and in consultation with Ethan Torres today.  Zelphia Higashi, MD [facility] [phone]

## 2024-04-22 ENCOUNTER — Other Ambulatory Visit: Payer: Self-pay | Admitting: Thoracic Surgery (Cardiothoracic Vascular Surgery)

## 2024-04-22 DIAGNOSIS — R911 Solitary pulmonary nodule: Secondary | ICD-10-CM

## 2024-05-05 ENCOUNTER — Ambulatory Visit (HOSPITAL_COMMUNITY)
Admission: RE | Admit: 2024-05-05 | Discharge: 2024-05-05 | Disposition: A | Source: Ambulatory Visit | Attending: Thoracic Surgery (Cardiothoracic Vascular Surgery) | Admitting: Thoracic Surgery (Cardiothoracic Vascular Surgery)

## 2024-05-05 DIAGNOSIS — R911 Solitary pulmonary nodule: Secondary | ICD-10-CM | POA: Insufficient documentation

## 2024-05-20 ENCOUNTER — Ambulatory Visit

## 2024-05-20 VITALS — BP 127/79 | HR 60 | Resp 20 | Ht 71.0 in | Wt 165.0 lb

## 2024-05-20 DIAGNOSIS — I712 Thoracic aortic aneurysm, without rupture, unspecified: Secondary | ICD-10-CM | POA: Insufficient documentation

## 2024-05-20 DIAGNOSIS — R911 Solitary pulmonary nodule: Secondary | ICD-10-CM

## 2024-05-20 NOTE — Progress Notes (Signed)
 [address] [place] [address]             [phone]            MALE MINISH [id number] 1950-12-21   History of Present Illness:  Ethan Torres is a 73 year old man with medical history of hypertension, OSA, gout, BPH, carcinoma of the left kidney, left nephrectomy, non-smoker, prostate cancer, and hyperlipidemia who presents for 6 month follow up of a left lower lobe lung nodule.    He was last seen by Dr. Kerrin in June 2025 for a groundglass opacity in the left lower lobe.  It was recommended for the past year he undergo surgery but patient wanted to follow the area radiographically.     Groundglass opacity in the left lower lobe has increased in size since CT scan in 10/2023.  He still is feeling well and denies chest pain, shortness of breath and palpitations.     Medications Ordered Prior to Encounter[1]   ROS: Review of Systems  Respiratory: Negative.  Negative for cough and shortness of breath.   Cardiovascular: Negative.  Negative for chest pain, palpitations and leg swelling.     BP 127/79   Pulse 60   Resp 20   Ht 5' 11 (1.803 m)   Wt 165 lb (74.8 kg)   SpO2 100% Comment: RA  BMI 23.01 kg/m   Physical Exam Constitutional:      Appearance: Normal appearance.  HENT:     Head: Normocephalic and atraumatic.  Cardiovascular:     Rate and Rhythm: Normal rate and regular rhythm.     Heart sounds: Normal heart sounds, S1 normal and S2 normal.  Pulmonary:     Effort: Pulmonary effort is normal.     Breath sounds: Normal breath sounds.  Skin:    General: Skin is warm and dry.  Neurological:     General: No focal deficit present.     Mental Status: He is alert and oriented to person, place, and time.      Imaging: CLINICAL DATA:  Lung nodule. Follow-up left lower lobe ground-glass opacity.   EXAM: CT CHEST WITHOUT CONTRAST   TECHNIQUE: Multidetector CT imaging of the chest was performed following  the standard protocol without IV contrast.   RADIATION DOSE REDUCTION: This exam was performed according to the departmental dose-optimization program which includes automated exposure control, adjustment of the mA and/or kV according to patient size and/or use of iterative reconstruction technique.   COMPARISON:  10/25/2023 and chest CT 03/06/2022   FINDINGS: Cardiovascular: Coronary artery calcifications. Heart size is normal. No significant pericardial effusion. Atherosclerotic calcifications in thoracic aorta without aneurysm.   Mediastinum/Nodes: No mediastinal lymph node enlargement. No significant hilar lymph node enlargement but limited evaluation without intravascular contrast. No axillary lymph node enlargement. Esophagus is unremarkable.   Lungs/Pleura: Trachea and mainstem bronchi are patent. No pleural effusions. No airspace disease or lung consolidation. 3 mm nodule in the right upper lobe on image 23, sequence 302 is stable since 2023. Stable punctate nodule in the anterior right lung on image 38. Stable 3 mm nodule in the anterior right lung on image 69. Probable scarring along the lateral aspect of the right middle lobe on image 83. Multiple bilateral punctate nodules are stable since 2023.   Ground-glass opacity in the left lower lobe on image 98, sequence 302 measures approximately 1.7 x 1.2 cm and more conspicuous than  the exam from 2023 when it measured approximately 1.2 x 0.7 cm. This ground-glass opacity measured 1.4 x 1.1 cm on 10/25/2023. Lesion appears to be slightly larger in the AP dimension on the sagittal images, image 14, sequence 604 compared to 10/25/2023. There is a small central solid component with this lesion that measures approximately 0.4 cm and not significantly changed   Upper Abdomen: Left nephrectomy. No acute abnormality in the visualized upper abdomen.   Musculoskeletal: No acute bone abnormality.   IMPRESSION: 1. Persistent left  lower lobe ground-glass opacity with a central small solid component. This lesion has enlarged and become more conspicuous since 2023. Concern for minimal enlargement since 10/25/2023. Findings are suspicious for a primary bronchogenic carcinoma such as an adenocarcinoma. 2. Multiple small pulmonary nodules are stable since 2023. 3. Aortic Atherosclerosis (ICD10-I70.0). 4. Coronary artery calcifications.     Electronically Signed   By: Juliene Balder M.D.   On: 05/05/2024 13:03    A/P:  Lung nodule - Persistent left lower lobe ground-glass opacity with a central small solid component measuring 1.7 x 1.2 cm. This lesion has enlarged and become more conspicuous since 2023. -Discussed that with lesion increasing in size and area becoming more conspicuous, surgery is recommended over continued radiographically follow up  -He is agreeable to surgery at this time  -Follow up appointment made with Dr. Kerrin to discuss and schedule surgery    [name], PA-C 05/20/2024      [1]  Current Outpatient Medications on File Prior to Visit  Medication Sig Dispense Refill   acetaminophen  (TYLENOL ) 500 MG tablet Take by mouth.     atenolol (TENORMIN) 50 MG tablet Take by mouth.     chlorthalidone (HYGROTON) 25 MG tablet Take by mouth.     cholecalciferol (VITAMIN D3) 25 MCG (1000 UNIT) tablet Take 1,000 Units by mouth daily.     losartan (COZAAR) 100 MG tablet Take 100 mg by mouth.     metoCLOPramide  (REGLAN ) 10 MG tablet Take 1 tablet (10 mg total) by mouth every 6 (six) hours as needed for nausea. 30 tablet 0   simvastatin (ZOCOR) 10 MG tablet Take by mouth.     tamsulosin  (FLOMAX ) 0.4 MG CAPS capsule Take 1 capsule by mouth daily.     zolpidem (AMBIEN) 10 MG tablet Take by mouth.     tamsulosin  (FLOMAX ) 0.4 MG CAPS capsule Take 1 capsule (0.4 mg total) by mouth daily. (Patient not taking: Reported on 05/20/2024) 10 capsule 0   No current facility-administered medications on  file prior to visit.

## 2024-05-20 NOTE — Patient Instructions (Signed)
-  Follow up with Dr. Kerrin on 06/17/2024

## 2024-06-17 ENCOUNTER — Other Ambulatory Visit: Payer: Self-pay | Admitting: *Deleted

## 2024-06-17 ENCOUNTER — Encounter: Payer: Self-pay | Admitting: Thoracic Surgery (Cardiothoracic Vascular Surgery)

## 2024-06-17 ENCOUNTER — Ambulatory Visit
Attending: Thoracic Surgery (Cardiothoracic Vascular Surgery) | Admitting: Thoracic Surgery (Cardiothoracic Vascular Surgery)

## 2024-06-17 ENCOUNTER — Other Ambulatory Visit: Payer: Self-pay | Admitting: Thoracic Surgery (Cardiothoracic Vascular Surgery)

## 2024-06-17 ENCOUNTER — Encounter: Payer: Self-pay | Admitting: *Deleted

## 2024-06-17 VITALS — BP 149/80 | HR 60 | Resp 20 | Ht 71.0 in | Wt 167.2 lb

## 2024-06-17 DIAGNOSIS — R911 Solitary pulmonary nodule: Secondary | ICD-10-CM | POA: Diagnosis not present

## 2024-06-17 NOTE — Progress Notes (Signed)
 "  [address], [place] [address]             [phone]     HPI: Ethan Torres returns to discuss management of his left lower lobe lung nodule.  Ethan Torres is a 74 year old lifelong non-smoker with a history of hypertension, hyperlipidemia, renal cell carcinoma, left nephrectomy, sleep apnea, gout, arthritis, coronary calcification, and a left lower lobe lung nodule.  He was found to have a ground glass opacity of the left lower lobe back in 2017.  He was followed.  In May 2024 he developed a small central solid component.  I first saw him in October 2024.  I recommended resection.  He refused and wanted to continue with radiographic follow-up.  I last saw him in June 2025.  I again recommended surgery, but he again refused.  In December he had a follow-up.  The nodule had not changed dramatically over the past 6 months but had significantly increased over the course of the past couple of years.  He saw Morna Rough, PA.  He was now agreeable to considering surgery.  He denies any chest pain, pressure, tightness.  No coughing wheezing or worse of breath.  He does have sleep apnea and uses CPAP at night.  No change in appetite or weight loss.  No headaches or visual changes.  Patient Active Problem List   Diagnosis Date Noted   Lung nodule 06/17/2024   Thoracic aortic aneurysm (TAA) 05/20/2024   Anxiety 02/06/2023   Benign prostatic hyperplasia with incomplete bladder emptying 12/09/2019   Carcinoma of left kidney (HCC) 12/09/2019   Increased frequency of urination 12/09/2019   Hyperlipidemia 02/20/2019   OSA (obstructive sleep apnea) 02/20/2019   Renal mass 06/27/2018   Lateral epicondylitis, right elbow 09/05/2014   Insomnia 11/21/2012   Gout of big toe 04/02/2011   Chest pain 02/18/2011   Essential hypertension 02/18/2011   Past Surgical History:  Procedure Laterality Date   NEPHRECTOMY Left      Current Outpatient Medications  Medication Sig  Dispense Refill   acetaminophen  (TYLENOL ) 500 MG tablet Take by mouth.     atenolol (TENORMIN) 50 MG tablet Take by mouth.     chlorthalidone (HYGROTON) 25 MG tablet Take by mouth.     cholecalciferol (VITAMIN D3) 25 MCG (1000 UNIT) tablet Take 1,000 Units by mouth daily.     losartan (COZAAR) 100 MG tablet Take 100 mg by mouth.     simvastatin (ZOCOR) 10 MG tablet Take by mouth.     zolpidem (AMBIEN) 10 MG tablet Take by mouth.     No current facility-administered medications for this visit.   Social History   Socioeconomic History   Marital status: Single    Spouse name: Not on file   Number of children: Not on file   Years of education: Not on file   Highest education level: Not on file  Occupational History   Not on file  Tobacco Use   Smoking status: Never   Smokeless tobacco: Not on file  Substance and Sexual Activity   Alcohol use: No   Drug use: No   Sexual activity: Not on file  Other Topics Concern   Not on file  Social History Narrative   Not on file   Social Drivers of Health   Tobacco Use: Unknown (06/17/2024)   Patient History    Smoking Tobacco Use: Never    Smokeless Tobacco Use: Unknown  Passive Exposure: Not on file  Financial Resource Strain: Not on file  Food Insecurity: Not on file  Transportation Needs: Not on file  Physical Activity: Not on file  Stress: Not on file  Social Connections: Unknown (10/07/2021)   [facility]   Social Network    Social Network: Not on file  Intimate Partner Violence: Not At Risk (01/15/2023)   Received from [facility]   HITS    Over the last 12 months how often did your partner physically hurt you?: Never    Over the last 12 months how often did your partner insult you or talk down to you?: Never    Over the last 12 months how often did your partner threaten you with physical harm?: Never    Over the last 12 months how often did your partner scream or curse at you?: Never  Depression (PHQ2-9):  Not on file  Alcohol Screen: Not on file  Housing: Not on file  Utilities: Not on file  Health Literacy: Not on file     Physical Exam BP (!) 149/80 (BP Location: Left Arm, Patient Position: Sitting, Cuff Size: Normal)   Pulse 60   Resp 20   Ht 5' 11 (1.803 m)   Wt 167 lb 3.2 oz (75.8 kg)   SpO2 100% Comment: RA  BMI 23.50 kg/m  74 year old man in no acute distress Alert and oriented x 3 with no focal deficits HEENT within normal limits No cervical or supraclavicular adenopathy Cardiac regular rate and rhythm Lungs clear with equal breath sounds bilaterally Abdomen soft No peripheral edema  Diagnostic Tests: CT CHEST WITHOUT CONTRAST   TECHNIQUE: Multidetector CT imaging of the chest was performed following the standard protocol without IV contrast.   RADIATION DOSE REDUCTION: This exam was performed according to the departmental dose-optimization program which includes automated exposure control, adjustment of the mA and/or kV according to patient size and/or use of iterative reconstruction technique.   COMPARISON:  10/25/2023 and chest CT 03/06/2022   FINDINGS: Cardiovascular: Coronary artery calcifications. Heart size is normal. No significant pericardial effusion. Atherosclerotic calcifications in thoracic aorta without aneurysm.   Mediastinum/Nodes: No mediastinal lymph node enlargement. No significant hilar lymph node enlargement but limited evaluation without intravascular contrast. No axillary lymph node enlargement. Esophagus is unremarkable.   Lungs/Pleura: Trachea and mainstem bronchi are patent. No pleural effusions. No airspace disease or lung consolidation. 3 mm nodule in the right upper lobe on image 23, sequence 302 is stable since 2023. Stable punctate nodule in the anterior right lung on image 38. Stable 3 mm nodule in the anterior right lung on image 69. Probable scarring along the lateral aspect of the right middle lobe on image 83. Multiple  bilateral punctate nodules are stable since 2023.   Ground-glass opacity in the left lower lobe on image 98, sequence 302 measures approximately 1.7 x 1.2 cm and more conspicuous than the exam from 2023 when it measured approximately 1.2 x 0.7 cm. This ground-glass opacity measured 1.4 x 1.1 cm on 10/25/2023. Lesion appears to be slightly larger in the AP dimension on the sagittal images, image 14, sequence 604 compared to 10/25/2023. There is a small central solid component with this lesion that measures approximately 0.4 cm and not significantly changed   Upper Abdomen: Left nephrectomy. No acute abnormality in the visualized upper abdomen.   Musculoskeletal: No acute bone abnormality.   IMPRESSION: 1. Persistent left lower lobe ground-glass opacity with a central small solid component. This  lesion has enlarged and become more conspicuous since 2023. Concern for minimal enlargement since 10/25/2023. Findings are suspicious for a primary bronchogenic carcinoma such as an adenocarcinoma. 2. Multiple small pulmonary nodules are stable since 2023. 3. Aortic Atherosclerosis (ICD10-I70.0). 4. Coronary artery calcifications.     Electronically Signed   By: Juliene Balder M.D.   On: 05/05/2024 13:03 I personally reviewed the CT images.  1.7 x 1.2 cm subsolid lesion with small solid component in basilar left lower lobe increased density compared to previous scans.  Impression: Ethan Torres is a 74 year old lifelong non-smoker with a history of hypertension, hyperlipidemia, renal cell carcinoma, left nephrectomy, sleep apnea, gout, arthritis, coronary calcification, and a left lower lobe lung nodule.  Left lower lobe lung nodule-subsolid lesion that has increased in size over time and has developed a small central solid component.  Not much change in size on his most recent CT but there is an increase in density.  I have been recommending surgery for well over a year and a half, but he  previously had refused.  Now is amenable to undergoing surgical resection.  I described the proposed operative procedure to Ethan Torres.  The plan would be to do a robotic bronchoscopy to mark the nodule followed by robotic assisted right lower lobe wedge resection or segmentectomy depending on intraoperative findings.  He understands the need for general anesthesia, incision to be used, use of the surgical robot, the use of a drainage tube postoperatively, the expected hospital stay, and the overall recovery.  I informed him of the indications, risks, benefits, and alternatives.  He stands the risks include, but are not limited to death, MI, DVT, PE, bleeding, possible need for transfusion, infection, prolonged air leak, cardiac arrhythmias, as well as possibility of other unforeseeable complications.  He is now willing to proceed with surgical resection.  He does have coronary calcification on CT.  No history of coronary events.  He denies any anginal type symptoms.  Plan: PFTs with without bronchodilators PET/CT to guide initial diagnostic workup Robotic bronchoscopy to mark nodule followed by robotic assisted right lower lobe wedge resection or segmentectomy.  I spent over 40 minutes in review of records, images and in consultation with Ethan Torres today. Elspeth JAYSON Millers, MD [facility] [phone]     "

## 2024-06-27 ENCOUNTER — Encounter: Payer: Self-pay | Admitting: *Deleted

## 2024-06-27 ENCOUNTER — Ambulatory Visit (HOSPITAL_COMMUNITY)
Admission: RE | Admit: 2024-06-27 | Discharge: 2024-06-27 | Disposition: A | Source: Ambulatory Visit | Attending: Thoracic Surgery (Cardiothoracic Vascular Surgery) | Admitting: Thoracic Surgery (Cardiothoracic Vascular Surgery)

## 2024-06-27 DIAGNOSIS — R911 Solitary pulmonary nodule: Secondary | ICD-10-CM | POA: Diagnosis present

## 2024-06-27 LAB — GLUCOSE, CAPILLARY: Glucose-Capillary: 93 mg/dL (ref 70–99)

## 2024-06-27 MED ORDER — FLUDEOXYGLUCOSE F - 18 (FDG) INJECTION
8.0000 | Freq: Once | INTRAVENOUS | Status: AC
  Administered 2024-06-27: 8.3 via INTRAVENOUS

## 2024-07-02 ENCOUNTER — Other Ambulatory Visit (HOSPITAL_COMMUNITY)

## 2024-07-02 ENCOUNTER — Encounter (HOSPITAL_COMMUNITY)

## 2024-07-29 ENCOUNTER — Other Ambulatory Visit (HOSPITAL_COMMUNITY)

## 2024-07-29 ENCOUNTER — Encounter (HOSPITAL_COMMUNITY)

## 2024-07-31 ENCOUNTER — Encounter (HOSPITAL_COMMUNITY): Admission: RE | Payer: Self-pay | Source: Home / Self Care

## 2024-07-31 ENCOUNTER — Ambulatory Visit (HOSPITAL_COMMUNITY)
Admission: RE | Admit: 2024-07-31 | Source: Home / Self Care | Admitting: Thoracic Surgery (Cardiothoracic Vascular Surgery)

## 2024-07-31 SURGERY — WEDGE RESECTION, LUNG, ROBOT-ASSISTED, THORACOSCOPIC
Anesthesia: General | Site: Chest | Laterality: Left
